# Patient Record
Sex: Female | Born: 1955 | Race: White | Hispanic: No | Marital: Married | Smoking: Former smoker
Health system: Southern US, Community
[De-identification: ages and names within clinical notes are randomized; demographics above are authoritative.]

## PROBLEM LIST (undated history)

## (undated) DIAGNOSIS — K219 Gastro-esophageal reflux disease without esophagitis: Secondary | ICD-10-CM

## (undated) DIAGNOSIS — M722 Plantar fascial fibromatosis: Secondary | ICD-10-CM

## (undated) DIAGNOSIS — M1990 Unspecified osteoarthritis, unspecified site: Secondary | ICD-10-CM

## (undated) DIAGNOSIS — F419 Anxiety disorder, unspecified: Secondary | ICD-10-CM

## (undated) DIAGNOSIS — K5790 Diverticulosis of intestine, part unspecified, without perforation or abscess without bleeding: Secondary | ICD-10-CM

## (undated) HISTORY — PX: OTHER SURGICAL HISTORY: SHX169

## (undated) HISTORY — PX: EYE SURGERY: SHX253

## (undated) HISTORY — PX: MOUTH SURGERY: SHX715

---

## 1998-09-18 ENCOUNTER — Other Ambulatory Visit: Admission: RE | Admit: 1998-09-18 | Discharge: 1998-09-18 | Payer: Self-pay | Admitting: Gynecology

## 2001-09-28 ENCOUNTER — Encounter: Payer: Self-pay | Admitting: Family Medicine

## 2001-09-28 ENCOUNTER — Encounter: Admission: RE | Admit: 2001-09-28 | Discharge: 2001-09-28 | Payer: Self-pay | Admitting: Family Medicine

## 2004-11-24 ENCOUNTER — Encounter: Admission: RE | Admit: 2004-11-24 | Discharge: 2004-11-24 | Payer: Self-pay | Admitting: Family Medicine

## 2004-12-09 ENCOUNTER — Other Ambulatory Visit: Admission: RE | Admit: 2004-12-09 | Discharge: 2004-12-09 | Payer: Self-pay | Admitting: Family Medicine

## 2007-03-13 ENCOUNTER — Other Ambulatory Visit: Admission: RE | Admit: 2007-03-13 | Discharge: 2007-03-13 | Payer: Self-pay | Admitting: Family Medicine

## 2007-03-13 ENCOUNTER — Encounter: Admission: RE | Admit: 2007-03-13 | Discharge: 2007-03-13 | Payer: Self-pay | Admitting: Family Medicine

## 2008-03-18 ENCOUNTER — Encounter: Admission: RE | Admit: 2008-03-18 | Discharge: 2008-03-18 | Payer: Self-pay | Admitting: Family Medicine

## 2008-04-04 ENCOUNTER — Encounter (INDEPENDENT_AMBULATORY_CARE_PROVIDER_SITE_OTHER): Payer: Self-pay | Admitting: Gynecology

## 2008-04-04 ENCOUNTER — Ambulatory Visit (HOSPITAL_BASED_OUTPATIENT_CLINIC_OR_DEPARTMENT_OTHER): Admission: RE | Admit: 2008-04-04 | Discharge: 2008-04-04 | Payer: Self-pay | Admitting: Gynecology

## 2009-05-05 ENCOUNTER — Encounter: Admission: RE | Admit: 2009-05-05 | Discharge: 2009-05-05 | Payer: Self-pay | Admitting: Gynecology

## 2010-06-02 ENCOUNTER — Encounter: Admission: RE | Admit: 2010-06-02 | Discharge: 2010-06-02 | Payer: Self-pay | Admitting: Gynecology

## 2010-11-17 NOTE — Op Note (Signed)
Stephanie Ford, Stephanie Ford               ACCOUNT NO.:  [id number]   MEDICAL RECORD NO.:  [id number]          PATIENT TYPE:  AMB   LOCATION:  [facility]                         FACILITY:  [facility]   PHYSICIAN:  Gretta Cool, M.D. DATE OF BIRTH:  [birth year]   DATE OF PROCEDURE:  04/04/2008  DATE OF DISCHARGE:                               OPERATIVE REPORT   PREOPERATIVE DIAGNOSIS:  Endometrial polyp versus fibroid, anterior  fundal wall.   POSTOPERATIVE DIAGNOSIS:  Submucous fibroid on the anterior fundal wall.   SURGEON:  Gretta Cool, M.D.   ANESTHESIA:  IV sedation and paracervical block.   DESCRIPTION OF PROCEDURE:  Under excellent IV sedation with the patient  prepped and draped in Allen stirrups, the cervix was grasped with a  single-tooth tenaculum and progressively dilated with a series of Pratt  dilators to accommodate the 7 mm resectoscope.  The resectoscope was  introduced and the endometrial cavity examined.  The large submucous  fibroid identified previously on ultrasound was photographed and then  resected.  The resection was carried out within the capsule of the  fibroid so as to remove and enucleate the fibroid entirely.  After  resection of that fibroid, the endometrial cavity was then resected  fully, so as to eliminate any probability of a recurrence of fibroids  encroaching the cavity, making further abnormal bleeding.  During the  resection, other fibroids were identified that were pea-sized and  resected, along with endometrial resection.  At this point, once the  cavity was completely resected, there is no evidence of endometrial  tissue remaining.  The cavity was then treated with VaporTrode 360  degrees, so as to eliminate any islands of endometrial tissue in the  superficial myometrium.  At this point, the procedure was terminated  without complication.  The patient returned to the recovery room in  excellent condition.     ______________________________  Gretta Cool, M.D.     CWL/MEDQ  D:  04/04/2008  T:  04/04/2008  Job:  [id number]   cc:   Katrina Stack, NP

## 2011-03-09 ENCOUNTER — Other Ambulatory Visit: Payer: Self-pay | Admitting: Gynecology

## 2011-04-06 LAB — POCT PREGNANCY, URINE: Preg Test, Ur: NEGATIVE

## 2011-05-14 ENCOUNTER — Other Ambulatory Visit: Payer: Self-pay | Admitting: Gynecology

## 2011-05-14 DIAGNOSIS — Z1231 Encounter for screening mammogram for malignant neoplasm of breast: Secondary | ICD-10-CM

## 2011-06-08 ENCOUNTER — Ambulatory Visit
Admission: RE | Admit: 2011-06-08 | Discharge: 2011-06-08 | Disposition: A | Discharge status: Home / Self Care | Payer: Self-pay | Source: Ambulatory Visit | Attending: Gynecology | Admitting: Gynecology

## 2011-06-08 DIAGNOSIS — Z1231 Encounter for screening mammogram for malignant neoplasm of breast: Secondary | ICD-10-CM

## 2012-04-05 ENCOUNTER — Other Ambulatory Visit: Payer: Self-pay | Admitting: Gynecology

## 2012-05-18 ENCOUNTER — Other Ambulatory Visit: Payer: Self-pay | Admitting: Family Medicine

## 2012-05-18 DIAGNOSIS — Z139 Encounter for screening, unspecified: Secondary | ICD-10-CM

## 2012-06-08 ENCOUNTER — Ambulatory Visit (INDEPENDENT_AMBULATORY_CARE_PROVIDER_SITE_OTHER): Payer: Managed Care, Other (non HMO)

## 2012-06-08 DIAGNOSIS — Z1231 Encounter for screening mammogram for malignant neoplasm of breast: Secondary | ICD-10-CM

## 2012-06-08 DIAGNOSIS — Z139 Encounter for screening, unspecified: Secondary | ICD-10-CM

## 2013-06-11 ENCOUNTER — Other Ambulatory Visit: Payer: Self-pay | Admitting: Gynecology

## 2013-06-11 DIAGNOSIS — Z1231 Encounter for screening mammogram for malignant neoplasm of breast: Secondary | ICD-10-CM

## 2013-06-12 ENCOUNTER — Ambulatory Visit (INDEPENDENT_AMBULATORY_CARE_PROVIDER_SITE_OTHER): Payer: Managed Care, Other (non HMO)

## 2013-06-12 DIAGNOSIS — Z1231 Encounter for screening mammogram for malignant neoplasm of breast: Secondary | ICD-10-CM

## 2015-08-21 ENCOUNTER — Other Ambulatory Visit: Payer: Self-pay | Admitting: Gynecology

## 2015-08-21 DIAGNOSIS — N63 Unspecified lump in unspecified breast: Secondary | ICD-10-CM

## 2015-08-27 ENCOUNTER — Ambulatory Visit
Admission: RE | Admit: 2015-08-27 | Discharge: 2015-08-27 | Disposition: A | Discharge status: Home / Self Care | Payer: Managed Care, Other (non HMO) | Source: Ambulatory Visit | Attending: Gynecology | Admitting: Gynecology

## 2015-08-27 DIAGNOSIS — N63 Unspecified lump in unspecified breast: Secondary | ICD-10-CM

## 2017-04-13 ENCOUNTER — Other Ambulatory Visit: Payer: Self-pay | Admitting: Gastroenterology

## 2017-04-13 DIAGNOSIS — K5732 Diverticulitis of large intestine without perforation or abscess without bleeding: Secondary | ICD-10-CM

## 2017-04-27 ENCOUNTER — Ambulatory Visit
Admission: RE | Admit: 2017-04-27 | Discharge: 2017-04-27 | Disposition: A | Discharge status: Home / Self Care | Payer: 59 | Source: Ambulatory Visit | Attending: Gastroenterology | Admitting: Gastroenterology

## 2017-04-27 DIAGNOSIS — K5732 Diverticulitis of large intestine without perforation or abscess without bleeding: Secondary | ICD-10-CM

## 2017-04-27 MED ORDER — IOPAMIDOL (ISOVUE-300) INJECTION 61%
100.0000 mL | Freq: Once | INTRAVENOUS | Status: AC | PRN
  Administered 2017-04-27: 100 mL via INTRAVENOUS

## 2017-04-28 ENCOUNTER — Other Ambulatory Visit: Payer: Self-pay | Admitting: Gastroenterology

## 2017-04-28 ENCOUNTER — Ambulatory Visit
Admission: RE | Admit: 2017-04-28 | Discharge: 2017-04-28 | Disposition: A | Discharge status: Home / Self Care | Payer: Self-pay | Source: Ambulatory Visit | Attending: Gastroenterology | Admitting: Gastroenterology

## 2017-04-28 DIAGNOSIS — R52 Pain, unspecified: Secondary | ICD-10-CM

## 2017-05-11 ENCOUNTER — Ambulatory Visit: Payer: Self-pay | Admitting: General Surgery

## 2017-05-11 NOTE — H&P (Signed)
Stephanie Ford 05/11/2017 10:29 AM Location: [facility] Patient #: [id number] DOB: 15-Aug-1955 Married / Language: [place] / Race: White Female  History of Present Illness Stephanie Ford(Stephanie Ford; 05/11/2017 1:08 PM) The patient is a 61 year old female who presents with diverticulitis. She is referred by Dr Ewing SchleinMagod for ongoing complicated diveritculitis. She lives in [place] where her husband is a Buyer, retailgovernment contractor. She reports that around the first week of September she had left lower quadrant pain. After about a week it did not improve so she went to the emergency room and [place] the hospital [place] outside country. She underwent a CT scan which showed sigmoid diverticulitis with several undrainable abscesses. She was placed on IV antibiotics and admitted for a week. Repeat CT showed no improvement and the surgeons in that country recommended surgery however [place]. She saw gastroenterology and was placed back on oral antibiotics which had to be switched to a different type due to intolerance. She underwent a repeat CT scan on October 24 which showed no significant improvement in the diverticular abscess. She has an ongoing fluid collection of 4.8 x 3.4 cm with multiple small gastric loculations which are not felt to be amenable to percutaneous drainage. The gas collection abuts the right ovary. She completed her antibiotic course. She denies any fevers, chills, diarrhea. She is taking MiraLAX daily. Her appetite is finally back to normal. She denies any nausea or vomiting. She is still having intermittent symptoms though. Today has been a good day yesterday she had nagging pain along her left flank and left side the entire day.  She states that she had a prior colonoscopy with Dr. Madilyn FiremanHayes about 3 years ago which was unremarkable.  She denies any melena or hematochezia   Problem List/Past Medical Stephanie Ford(Stephanie Greb M. Andrey CampanileWilson,  Ford; 05/11/2017 1:15 PM) ABSCESS OF SIGMOID COLON DUE TO DIVERTICULITIS (E45.40(K57.20)  Past Surgical History Christianne Dolin(Christen Lambert, RMA; 05/11/2017 10:29 AM) Oral Surgery  Diagnostic Studies History Christianne Dolin(Christen Lambert, [place]; 05/11/2017 10:29 AM) Colonoscopy 1-5 years ago Mammogram 1-3 years ago Pap Smear 1-5 years ago  Allergies Christianne Dolin(Christen Lambert, RMA; 05/11/2017 10:30 AM) CODEINE Vomiting, Itching. skin crawling  Medication History Christianne Dolin(Christen Lambert, RMA; 05/11/2017 10:30 AM) Vassie MoselleMinivelle (0.05MG /98JX/24HR Patch TW, Transdermal) Active. Pantoprazole Sodium (40MG  Tablet DR, Oral) Active. Progesterone Micronized (200MG  Capsule, Oral) Active. Polyethylene Glycol 3350 (Oral) Active. Advil (200MG  Capsule, Oral) Active. Probiotic (Oral) Active. Medications Reconciled  Social History Christianne Dolin(Christen Lambert, [place]; 05/11/2017 10:29 AM) Alcohol use Moderate alcohol use. Caffeine use Coffee. No drug use Tobacco use Former smoker.  Family History Christianne Dolin(Christen Lambert, [place]; 05/11/2017 10:29 AM) Arthritis Mother. Cancer Sister. Heart Disease Father. Prostate Cancer Father. Respiratory Condition Father.  Pregnancy / Birth History Christianne Dolin(Christen Lambert, [place]; 05/11/2017 10:29 AM) Age at menarche 14 years. Age of menopause [age] Gravida 0 Irregular periods Para 0  Other Problems Stephanie Ford(Stephanie Ford; 05/11/2017 1:15 PM) Arthritis Back Pain Diverticulosis Gastroesophageal Reflux Disease     Review of Systems Christianne Dolin(Christen Lambert RMA; 05/11/2017 10:29 AM) General Not Present- Appetite Loss, Chills, Fatigue, Fever, Night Sweats, Weight Gain and Weight Loss. Skin Not Present- Change in Wart/Mole, Dryness, Hives, Jaundice, New Lesions, Non-Healing Wounds, Rash and Ulcer. HEENT Not Present- Earache, Hearing Loss, Hoarseness, Nose Bleed, Oral Ulcers, Ringing in the Ears, Seasonal Allergies, Sinus Pain, Sore Throat, Visual Disturbances, Wears glasses/contact lenses and Yellow Eyes. Respiratory Not  Present- Bloody sputum, Chronic Cough, Difficulty Breathing, Snoring and Wheezing. Breast  Not Present- Breast Mass, Breast Pain, Nipple Discharge and Skin Changes. Cardiovascular Not Present- Chest Pain, Difficulty Breathing Lying Down, Leg Cramps, Palpitations, Rapid Heart Rate, Shortness of Breath and Swelling of Extremities. Gastrointestinal Present- Abdominal Pain, Change in Bowel Habits and Constipation. Not Present- Bloating, Bloody Stool, Chronic diarrhea, Difficulty Swallowing, Excessive gas, Gets full quickly at meals, Hemorrhoids, Indigestion, Nausea, Rectal Pain and Vomiting. Female Genitourinary Not Present- Frequency, Nocturia, Painful Urination, Pelvic Pain and Urgency. Musculoskeletal Not Present- Back Pain, Joint Pain, Joint Stiffness, Muscle Pain, Muscle Weakness and Swelling of Extremities. Neurological Not Present- Decreased Memory, Fainting, Headaches, Numbness, Seizures, Tingling, Tremor, Trouble walking and Weakness. Psychiatric Not Present- Anxiety, Bipolar, Change in Sleep Pattern, Depression, Fearful and Frequent crying. Endocrine Not Present- Cold Intolerance, Excessive Hunger, Hair Changes, Heat Intolerance, Hot flashes and New Diabetes. Hematology Not Present- Blood Thinners, Easy Bruising, Excessive bleeding, Gland problems, HIV and Persistent Infections.  Vitals Christianne Dolin(Christen Lambert RMA; 05/11/2017 10:31 AM) 05/11/2017 10:31 AM Weight: 186 lb Height: 64in Body Surface Area: 1.9 m Body Mass Index: 31.93 kg/m  Temp.: 97.65F  Pulse: 80 (Regular)  BP: 142/90 (Sitting, Left Arm, Standard)      Physical Exam Stephanie Ford(Stephanie Ford; 05/11/2017 1:03 PM)  General Mental Status-Alert. General Appearance-Consistent with stated age. Hydration-Well hydrated. Voice-Normal.  Head and Neck Head-normocephalic, atraumatic with no lesions or palpable masses. Trachea-midline. Thyroid Gland Characteristics - normal size and consistency.  Eye Eyeball -  Bilateral-Extraocular movements intact. Sclera/Conjunctiva - Bilateral-No scleral icterus.  ENMT Mouth and Throat -Note:lips intact.  Note: normal external ears  Chest and Lung Exam Chest and lung exam reveals -quiet, even and easy respiratory effort with no use of accessory muscles and on auscultation, normal breath sounds, no adventitious sounds and normal vocal resonance. Inspection Chest Wall - Normal. Back - normal.  Breast - Did not examine.  Cardiovascular Cardiovascular examination reveals -normal heart sounds, regular rate and rhythm with no murmurs and normal pedal pulses bilaterally.  Abdomen Inspection Inspection of the abdomen reveals - No Hernias. Skin - Scar - no surgical scars. Palpation/Percussion Palpation and Percussion of the abdomen reveal - Soft, Non Tender, No Rebound tenderness, No Rigidity (guarding) and No hepatosplenomegaly. Auscultation Auscultation of the abdomen reveals - Bowel sounds normal.  Peripheral Vascular Upper Extremity Palpation - Pulses bilaterally normal.  Neurologic Neurologic evaluation reveals -alert and oriented x 3 with no impairment of recent or remote memory. Mental Status-Normal.  Neuropsychiatric The patient's mood and affect are described as -normal. Judgment and Insight-insight is appropriate concerning matters relevant to self.  Musculoskeletal Normal Exam - Left-Upper Extremity Strength Normal and Lower Extremity Strength Normal. Normal Exam - Right-Upper Extremity Strength Normal and Lower Extremity Strength Normal.  Lymphatic Head & Neck  General Head & Neck Lymphatics: Bilateral - Description - Normal. Axillary - Did not examine. Femoral & Inguinal - Did not examine.    Assessment & Plan Stephanie Ford(Stephanie Ford; 05/11/2017 1:16 PM)  ABSCESS OF SIGMOID COLON DUE TO DIVERTICULITIS (K57.20) Story: I then explained to her and her family member that I am out of town the next 2 weeks and that  probably my first availability would be sometime in mid December which I don't think the patient can wait for. Therefore I have discussed her case with Dr. Maisie Fushomas one of our colorectal surgeons who has agreed to take over her care. We will [facility]. At that point Dr. Maisie Fushomas will discuss surgical plans with the  patient. In the interim we have discussed the importance of good nutrition Impression: She has complicated sigmoid diverticulitis with phlegmon that has been going on since early September with no significant radiological improvement. I am not optimistic that another round of antibiotics will be of much benefit. It also doesn't appear to be amenable to percutaneous drainage. We discussed the long-term ramifications of smoldering complicated diverticulitis. At this point I recommended surgery. We discussed that the standard of care would be a sigmoid colectomy with end colostomy. We discussed that sometimes we may be able to do a primary one staged surgery but intraoperative conditions would have to be extremely favorable in order to do a resection with anastomosis. We discussed the risk of performing an anastomosis in a contaminated field which would be an increased risk of leak. We discussed that often times is an intraoperative decision about the safety of performing anastomosis  I discussed the procedure in detail. The patient was given Agricultural engineer. We discussed the risks and benefits of surgery including, but not limited to bleeding, infection (such as wound infection, abdominal abscess), injury to surrounding structures, blood clot formation, urinary retention, incisional hernia, colostomy issues (hernia/ischemia), [ anastomotic stricture, anastomotic leak], anesthesia risks, pulmonary & cardiac complications such as pneumonia &/or heart attack, need for additional procedures, ileus, & prolonged hospitalization. We discussed the typical  postoperative recovery course, including limitations & restrictions postoperatively. I explained that the likelihood of improvement in their symptoms is good.  The patient has elected to proceed with surgery.  Current Plans Schedule for Surgery Pt Education - Pamphlet Given - Colorectal Surgery: discussed with patient and provided information.  Mary Sella. Andrey Campanile, Ford, FACS General, Bariatric, & Minimally Invasive Surgery [facility], [place]

## 2017-05-12 NOTE — Progress Notes (Signed)
Please place orders in Epic as patient is being scheduled for a pre-op appointment! Thank you! 

## 2017-05-16 ENCOUNTER — Inpatient Hospital Stay (HOSPITAL_COMMUNITY): Payer: 59

## 2017-05-16 ENCOUNTER — Encounter (HOSPITAL_COMMUNITY): Payer: Self-pay

## 2017-05-16 ENCOUNTER — Other Ambulatory Visit: Payer: Self-pay

## 2017-05-16 ENCOUNTER — Observation Stay (HOSPITAL_COMMUNITY)
Admission: RE | Admit: 2017-05-16 | Discharge: 2017-05-17 | Disposition: A | Discharge status: Home / Self Care | Payer: 59 | Source: Ambulatory Visit | Attending: General Surgery | Admitting: General Surgery

## 2017-05-16 DIAGNOSIS — Z79899 Other long term (current) drug therapy: Secondary | ICD-10-CM | POA: Insufficient documentation

## 2017-05-16 DIAGNOSIS — K5720 Diverticulitis of large intestine with perforation and abscess without bleeding: Principal | ICD-10-CM | POA: Diagnosis present

## 2017-05-16 HISTORY — DX: Anxiety disorder, unspecified: F41.9

## 2017-05-16 HISTORY — DX: Gastro-esophageal reflux disease without esophagitis: K21.9

## 2017-05-16 LAB — BASIC METABOLIC PANEL
Anion gap: 7 (ref 5–15)
BUN: 9 mg/dL (ref 6–20)
CHLORIDE: 106 mmol/L (ref 101–111)
CO2: 27 mmol/L (ref 22–32)
CREATININE: 0.58 mg/dL (ref 0.44–1.00)
Calcium: 8.7 mg/dL — ABNORMAL LOW (ref 8.9–10.3)
GFR calc Af Amer: >60 mL/min (ref >60)
GFR calc non Af Amer: >60 mL/min (ref >60)
GLUCOSE: 103 mg/dL — AB (ref 65–99)
Potassium: 3.7 mmol/L (ref 3.5–5.1)
Sodium: 140 mmol/L (ref 135–145)

## 2017-05-16 LAB — CBC
HCT: 36 % (ref 36.0–46.0)
Hemoglobin: 12 g/dL (ref 12.0–15.0)
MCH: 30.6 pg (ref 26.0–34.0)
MCHC: 33.3 g/dL (ref 30.0–36.0)
MCV: 91.8 fL (ref 78.0–100.0)
PLATELETS: 292 10*3/uL (ref 150–400)
RBC: 3.92 MIL/uL (ref 3.87–5.11)
RDW: 14.8 % (ref 11.5–15.5)
WBC: 5.3 10*3/uL (ref 4.0–10.5)

## 2017-05-16 MED ORDER — PIPERACILLIN-TAZOBACTAM 3.375 G IVPB
3.3750 g | Freq: Three times a day (TID) | INTRAVENOUS | Status: DC
  Administered 2017-05-16 – 2017-05-17 (×3): 3.375 g via INTRAVENOUS
  Filled 2017-05-16 (×3): qty 50

## 2017-05-16 MED ORDER — POLYETHYLENE GLYCOL 3350 17 G PO PACK: 17.0000 g | PACK | Freq: Every day | ORAL | Status: DC | PRN

## 2017-05-16 MED ORDER — KCL IN DEXTROSE-NACL 20-5-0.45 MEQ/L-%-% IV SOLN
INTRAVENOUS | Status: DC
  Administered 2017-05-16: 15:00:00 via INTRAVENOUS
  Filled 2017-05-16 (×2): qty 1000

## 2017-05-16 MED ORDER — LORAZEPAM 0.5 MG PO TABS
0.5000 mg | ORAL_TABLET | Freq: Two times a day (BID) | ORAL | Status: DC | PRN
  Administered 2017-05-16: 0.5 mg via ORAL
  Filled 2017-05-16: qty 1

## 2017-05-16 MED ORDER — IOPAMIDOL (ISOVUE-300) INJECTION 61%
INTRAVENOUS | Status: AC
  Filled 2017-05-16: qty 30

## 2017-05-16 MED ORDER — ACETAMINOPHEN 325 MG PO TABS
650.0000 mg | ORAL_TABLET | Freq: Four times a day (QID) | ORAL | Status: DC | PRN
  Administered 2017-05-16: 650 mg via ORAL
  Filled 2017-05-16: qty 2

## 2017-05-16 MED ORDER — ACETAMINOPHEN 650 MG RE SUPP: 650.0000 mg | Freq: Four times a day (QID) | RECTAL | Status: DC | PRN

## 2017-05-16 MED ORDER — FENTANYL CITRATE (PF) 100 MCG/2ML IJ SOLN: 12.5000 ug | INTRAMUSCULAR | Status: DC | PRN

## 2017-05-16 MED ORDER — ONDANSETRON 4 MG PO TBDP: 4.0000 mg | ORAL_TABLET | Freq: Four times a day (QID) | ORAL | Status: DC | PRN

## 2017-05-16 MED ORDER — PANTOPRAZOLE SODIUM 40 MG IV SOLR: 40.0000 mg | Freq: Every day | INTRAVENOUS | Status: DC

## 2017-05-16 MED ORDER — SACCHAROMYCES BOULARDII 250 MG PO CAPS
250.0000 mg | ORAL_CAPSULE | Freq: Every day | ORAL | Status: DC
  Administered 2017-05-17: 250 mg via ORAL
  Filled 2017-05-16: qty 1

## 2017-05-16 MED ORDER — DIPHENHYDRAMINE HCL 25 MG PO CAPS: 25.0000 mg | ORAL_CAPSULE | Freq: Four times a day (QID) | ORAL | Status: DC | PRN

## 2017-05-16 MED ORDER — IOPAMIDOL (ISOVUE-300) INJECTION 61%
100.0000 mL | Freq: Once | INTRAVENOUS | Status: AC | PRN
  Administered 2017-05-16: 100 mL via INTRAVENOUS

## 2017-05-16 MED ORDER — MORPHINE SULFATE (PF) 2 MG/ML IV SOLN: 1.0000 mg | INTRAVENOUS | Status: DC | PRN

## 2017-05-16 MED ORDER — IOPAMIDOL (ISOVUE-300) INJECTION 61%
15.0000 mL | Freq: Two times a day (BID) | INTRAVENOUS | Status: AC | PRN
  Administered 2017-05-16 (×2): 15 mL via ORAL
  Filled 2017-05-16 (×2): qty 30

## 2017-05-16 MED ORDER — DIPHENHYDRAMINE HCL 50 MG/ML IJ SOLN: 25.0000 mg | Freq: Four times a day (QID) | INTRAMUSCULAR | Status: DC | PRN

## 2017-05-16 MED ORDER — TRAMADOL HCL 50 MG PO TABS: 50.0000 mg | ORAL_TABLET | Freq: Four times a day (QID) | ORAL | Status: DC | PRN

## 2017-05-16 MED ORDER — IOPAMIDOL (ISOVUE-300) INJECTION 61%
INTRAVENOUS | Status: AC
  Filled 2017-05-16: qty 100

## 2017-05-16 MED ORDER — ONDANSETRON HCL 4 MG/2ML IJ SOLN: 4.0000 mg | Freq: Four times a day (QID) | INTRAMUSCULAR | Status: DC | PRN

## 2017-05-16 MED ORDER — ENOXAPARIN SODIUM 40 MG/0.4ML ~~LOC~~ SOLN: 40.0000 mg | SUBCUTANEOUS | Status: DC

## 2017-05-16 MED ORDER — HYDRALAZINE HCL 20 MG/ML IJ SOLN: 5.0000 mg | Freq: Four times a day (QID) | INTRAMUSCULAR | Status: DC | PRN

## 2017-05-16 NOTE — H&P (Signed)
[facility] Admission Note  Stephanie Ford 04-15-1956  [id number].    Chief Complaint/Reason for Consult: diverticulitis with abscess  HPI:  The patient is a 61 year old female who presents with diverticulitis. She is being directly [facility] for surgical management of diverticulitis. She was referred to Dr. Gaynelle AduEric Wilson by Dr Ewing SchleinMagod for ongoing complicated diveritculitis. She lives in [place] where her husband is a Buyer, retailgovernment contractor. She reports that around the first week of September she had LLQ pain and after about a week it did not improve so she went to the emergency room and was [place] the hospital [place] outside country. She underwent a CT scan which showed sigmoid diverticulitis with several undrainable abscesses. She was placed on IV antibiotics and admitted for a week. Repeat CT showed no improvement and the surgeons in that country recommended surgery however the [place]. She saw GI and was placed back on oral antibiotics which had to be switched to a different type due to intolerance. She underwent a repeat CT scan on 04/27/17 which showed no significant improvement in the diverticular abscess. She has an ongoing fluid collection of 4.8 x 3.4 cm with multiple small gastric loculations which are not felt to be amenable to percutaneous drainage. The gas collection abuts the right ovary. She completed her antibiotic course. She denies any fevers, chills, diarrhea. She is taking MiraLAX daily. Her appetite is finally back to normal. She denies any nausea, vomiting, melena, or hematochezia. She is still having intermittent left lower abdominal/flank pain. [place]. 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 She has two adopted sons.   She reports nausea/vomiting to PO codeine pills after an oral surgery. States she has tolerated IV morphine after her GYN surgery many years ago.   ROS: Review of Systems  Constitutional: Negative for chills and fever.  Gastrointestinal: Positive for abdominal pain and constipation.  All other systems reviewed and are negative.   No family history on file.  No past medical history on file.  No past surgical history on file.  Social History:  has no tobacco, alcohol, and drug history on file.  Allergies:  Allergies  Allergen Reactions  . Codeine Itching and Nausea And Vomiting    Vomiting, itching, makes skin crawl     Medications Prior to Admission  Medication Sig Dispense Refill  . estradiol (MINIVELLE) 0.05 MG/24HR patch Place 1 patch See admin instructions onto the skin. Every 5 days    . ibuprofen (ADVIL,MOTRIN) 200 MG tablet Take 600 mg every 6 (six) hours as needed by mouth for mild pain or moderate pain.    Marland Kitchen. LORazepam (ATIVAN) 0.5 MG tablet Take 0.5 mg 2 (two) times daily as needed by mouth for anxiety.    . pantoprazole (PROTONIX) 40 MG tablet Take 40 mg daily by mouth.    . polyethylene glycol (MIRALAX / GLYCOLAX) packet Take 17 g daily as needed by mouth for mild constipation.    . Probiotic Product (PROBIOTIC DAILY) CAPS Take 1 capsule daily by mouth.    . progesterone (PROMETRIUM) 200 MG capsule Take 200 mg See admin instructions by mouth. Every other month - for 14 days  Blood pressure (!) 149/86, pulse 73, temperature 98 F (36.7 C), temperature source Oral, resp. rate 18, SpO2 100 %. Physical Exam: Physical Exam  Constitutional: She is oriented to person, place, and time. She appears well-developed and well-nourished. No distress.  HENT:  Head: Normocephalic and atraumatic.  Right Ear: External ear normal.  Left Ear: External ear normal.  Mouth/Throat: No oropharyngeal exudate.  Eyes: Conjunctivae and EOM  are normal. Pupils are equal, round, and reactive to light. Right eye exhibits no discharge. Left eye exhibits no discharge.  Neck: Normal range of motion. Neck supple. No JVD present. No tracheal deviation present. No thyromegaly present.  Cardiovascular: Normal rate, regular rhythm and normal heart sounds. Exam reveals no gallop and no friction rub.  No murmur heard. Pulmonary/Chest: Effort normal and breath sounds normal. No stridor. No respiratory distress. She has no wheezes. She has no rales.  Abdominal: Soft. Bowel sounds are normal. She exhibits no distension and no mass. There is tenderness. There is no rebound and no guarding.  Deep palpation LLQ elicits left flank tenderness.  Musculoskeletal: Normal range of motion. She exhibits no edema or deformity.  Neurological: She is alert and oriented to person, place, and time. No sensory deficit.  Skin: Skin is warm and dry. No rash noted. She is not diaphoretic.  Psychiatric: She has a normal mood and affect. Her behavior is normal.    No results found for this or any previous visit (from the past 48 hour(s)). No results found.  Assessment/Plan Sigmoid diverticulitis with abscess, refractory to medical treatment with IV/PO antibiotics   -  NPO. STAT CT abdomen to re-assess abscesses and see if they are amenable to percutaneous drainage.   - perc drainage with IV abx   Vs.  OR tomorrow for lap assisted sigmoid colectomy/colostomy   - draw baseline labs - CBC, CMET  - start IV abx, Zosyn   - will discuss desired bowel prep with MD.  - PRN pain control, antiemetics   FEN: NPO  ID: Zosyn 11/12 >> VTE: SCD's, Lovenox today then hold for surgery Foley: none    Adam PhenixElizabeth S Simaan, [facility] 05/16/2017, 10:16 AM Pager: [phone] Consults: [phone] Mon-Fri 7:00 am-4:30 pm Sat-Sun 7:00 am-11:30 am

## 2017-05-16 NOTE — Progress Notes (Signed)
ATTENDING ADDENDUM:  I personally reviewed patient's record, examined the patient, and formulated the following assessment and plan:  Pt with minimal symptoms.  Discussed with Dr Lowella DandyHenn of IR.  He felt that collection is able to be drained.  We will repeat her CT today and tentatively plan for drain placement tomorrow instead of surgery.  Discussed with Pt and she agrees.  If no improvement, can always perform surgery later.    Vanita PandaAlicia C Valari Taylor, MD  Colorectal and General Surgery [facility]

## 2017-05-17 ENCOUNTER — Encounter (HOSPITAL_COMMUNITY): Admission: RE | Disposition: A | Discharge status: Home / Self Care | Payer: Self-pay | Source: Ambulatory Visit

## 2017-05-17 ENCOUNTER — Ambulatory Visit: Payer: Self-pay | Admitting: General Surgery

## 2017-05-17 DIAGNOSIS — K5720 Diverticulitis of large intestine with perforation and abscess without bleeding: Secondary | ICD-10-CM | POA: Diagnosis not present

## 2017-05-17 LAB — HIV ANTIBODY (ROUTINE TESTING W REFLEX): HIV Screen 4th Generation wRfx: NONREACTIVE

## 2017-05-17 LAB — PROTIME-INR
INR: 1
PROTHROMBIN TIME: 13.1 s (ref 11.4–15.2)

## 2017-05-17 SURGERY — COLECTOMY, SIGMOID, LAPAROSCOPIC
Anesthesia: General

## 2017-05-17 NOTE — Discharge Summary (Signed)
Physician Discharge Summary  Patient ID: Stephanie Ford MRN: [id number] DOB/AGE: 1955/11/26 61 y.o.  Admit date: 05/16/2017 Discharge date: 05/17/2017  Admission Diagnoses: diverticular abscess  Discharge Diagnoses:  Active Problems:   Diverticulitis of large intestine with abscess   Discharged Condition: good  Hospital Course: Pt admitted for diverticular abscess.  Repeat CT performed which showed resolving abscess.  Unable to be drained in IR.  Pt will be scheduled for elective surgery next month.  Consults: None  Significant Diagnostic Studies: labs: cbc, chemistry  Treatments: IV hydration and antibiotics: Zosyn  Discharge Exam: Blood pressure (!) 155/82, pulse 66, temperature 98.2 F (36.8 C), temperature source Oral, resp. rate 16, height 5\' 4"  (1.626 m), weight 83.9 kg (185 lb), SpO2 98 %. General appearance: alert and cooperative GI: soft  Disposition: Home   Allergies as of 05/17/2017      Reactions   Codeine Itching, Nausea And Vomiting   Vomiting, itching, makes skin crawl       Medication List    TAKE these medications   ibuprofen 200 MG tablet Commonly known as:  ADVIL,MOTRIN Take 600 mg every 6 (six) hours as needed by mouth for mild pain or moderate pain.   LORazepam 0.5 MG tablet Commonly known as:  ATIVAN Take 0.5 mg 2 (two) times daily as needed by mouth for anxiety.   MINIVELLE 0.05 MG/24HR patch Generic drug:  estradiol Place 1 patch See admin instructions onto the skin. Every 5 days   pantoprazole 40 MG tablet Commonly known as:  PROTONIX Take 40 mg daily by mouth.   polyethylene glycol packet Commonly known as:  MIRALAX / GLYCOLAX Take 17 g daily as needed by mouth for mild constipation.   PROBIOTIC DAILY Caps Take 1 capsule daily by mouth.   progesterone 200 MG capsule Commonly known as:  PROMETRIUM Take 200 mg See admin instructions by mouth. Every other month - for 14 days        Signed: Vanita PandaHOMAS, Pearlina Friedly C. 05/17/2017,  10:22 AM

## 2017-05-17 NOTE — Progress Notes (Signed)
[facility] Progress Note     Subjective: CC: No pain at rest. L flank pain with certain movements. Denies fever, chills, nausea, vomiting.   Reports severe GI upset with augmentin.  Objective: Vital signs in last 24 hours: Temp:  [97.5 F (36.4 C)-98.2 F (36.8 C)] 98.2 F (36.8 C) (11/13 0628) Pulse Rate:  [66-77] 77 (11/13 0628) Resp:  [16-18] 18 (11/13 0628) BP: (124-149)/(71-86) 134/71 (11/13 0628) SpO2:  [97 %-100 %] 98 % (11/13 0628) Weight:  [83.9 kg (185 lb)] 83.9 kg (185 lb) (11/12 1442)    Intake/Output from previous day: 11/12 0701 - 11/13 0700 In: 216.7 [I.V.:166.7; IV Piggyback:50] Out: 1750 [Urine:1750] Intake/Output this shift: No intake/output data recorded.  PE: Gen:  Alert, NAD, pleasant Card:  Regular rate and rhythm, pedal pulses 2+ BL Pulm:  Normal effort, clear to auscultation bilaterally Abd: Soft, non-tender, non-distended, bowel sounds present in all 4 quadrants Skin: warm and dry, no rashes  Psych: A&Ox3   Lab Results:  Recent Labs    05/16/17 1022  WBC 5.3  HGB 12.0  HCT 36.0  PLT 292   BMET Recent Labs    05/16/17 1022  NA 140  K 3.7  CL 106  CO2 27  GLUCOSE 103*  BUN 9  CREATININE 0.58  CALCIUM 8.7*   PT/INR No results for input(s): LABPROT, INR in the last 72 hours. CMP     Component Value Date/Time   NA 140 05/16/2017 1022   K 3.7 05/16/2017 1022   CL 106 05/16/2017 1022   CO2 27 05/16/2017 1022   GLUCOSE 103 (H) 05/16/2017 1022   BUN 9 05/16/2017 1022   CREATININE 0.58 05/16/2017 1022   CALCIUM 8.7 (L) 05/16/2017 1022   GFRNONAA >60 05/16/2017 1022   GFRAA >60 05/16/2017 1022   Lipase  No results found for: LIPASE     Studies/Results: Ct Abdomen Pelvis W Contrast  Result Date: 05/16/2017 CLINICAL DATA:  Sigmoid diverticulitis with abscess. Evaluate for interval change. EXAM: CT ABDOMEN AND PELVIS WITH CONTRAST TECHNIQUE: Multidetector CT imaging of the abdomen and pelvis was performed  using the standard protocol following bolus administration of intravenous contrast. CONTRAST:  100mL ISOVUE-300 IOPAMIDOL (ISOVUE-300) INJECTION 61% COMPARISON:  CT abdomen pelvis dated April 27, 2017. FINDINGS: Lower chest: No acute abnormality. Hepatobiliary: No focal liver abnormality is seen. No gallstones, gallbladder wall thickening, or biliary dilatation. Pancreas: Unremarkable. No pancreatic ductal dilatation or surrounding inflammatory changes. Spleen: Normal in size without focal abnormality. Adrenals/Urinary Tract: Adrenal glands are unremarkable. Kidneys are normal, without renal calculi, focal lesion, or hydronephrosis. Bladder is unremarkable. Stomach/Bowel: Extensive left-sided diverticulosis again noted, with improving inflammation surrounding the mid sigmoid colon. The stomach and small bowel are unremarkable. Normal appendix. No bowel obstruction. Vascular/Lymphatic: No significant vascular findings are present. Probable mixing artifact in the proximal portal vein. No enlarged abdominal or pelvic lymph nodes. Reproductive: Phlegmonous change abuts the left ovary. The uterus and right ovary are grossly unremarkable. Other: Interval decrease in size of the diverticular abscess, difficult to measure, but with residual phlegmon and extraluminal gas measuring approximately 2.1 x 1.5 x 3.3 cm (AP by transverse by CC), previously 4.8 x 3.4 x 5.7 cm. Musculoskeletal: No acute or significant osseous findings. IMPRESSION: 1. Improving sigmoid diverticulosis with interval decrease in size of the diverticular abscess along the mid sigmoid colon. The abscess is difficult to measure, however, residual phlegmonous change and loculated extraluminal gas measures approximately 2.1 x 1.5 x 3.3 cm. Electronically Signed  By: Obie DredgeWilliam T Derry M.D.   On: 05/16/2017 16:55    Anti-infectives: Anti-infectives (From admission, onward)   Start     Dose/Rate Route Frequency Ordered Stop   05/16/17 1400   piperacillin-tazobactam (ZOSYN) IVPB 3.375 g     3.375 g 12.5 mL/hr over 240 Minutes Intravenous Every 8 hours 05/16/17 1014       Assessment/Plan Sigmoid diverticulitis with abscess, refractory to medical treatment with IV/PO antibiotics  -  NPO, IVF  - repeat CT 11/12 shows interval improvement of diverticular abscess. Dr. Grace IsaacWatts to review and give recommendations regarding drainage today. Coags pending.  - continue IV abx, Zosyn  - PRN pain and nausea meds - further surgical planning to follow IR recommendations.   FEN: NPO  ID: Zosyn 11/12 >> VTE: SCD's, Lovenox held for possible procedure.  Foley: none     LOS: 1 day    Adam PhenixElizabeth S Bronte Kropf , [facility] 05/17/2017, 8:10 AM Pager: [phone] Consults: [phone] Mon-Fri 7:00 am-4:30 pm Sat-Sun 7:00 am-11:30 am

## 2017-05-17 NOTE — Progress Notes (Signed)
Discharge instructions reviewed with patient. Questions answered. Patient denies further questions. Son is en route to drive patient home. No prescriptions given to patient. Lina SarBeth Javin Nong, RN

## 2017-06-09 NOTE — Patient Instructions (Addendum)
Stephanie Ford  06/09/2017   Your procedure is scheduled on: 06-15-17  Report to [facility] Main  Entrance Take [place]  elevators to 3rd floor to  Short Stay Center at  0600 AM.    Call this number if you have problems the morning of surgery [phone]    Remember: ONLY 1 PERSON MAY GO WITH YOU TO SHORT STAY TO GET  READY MORNING OF YOUR SURGERY.  Do not eat food :After Midnight.MONDAY NIGHT CLEAR LIQUIDS  ALL DAY Tuesday 06-14-17 WITH BOWEL PREP INSTRUCTIONS FROM DR Maisie Fus, NO CLEAR LIQUIDS AFTER MIDNIGHT Tuesday NIGHT.     Take these medicines the morning of surgery with A SIP OF WATER: PROTONIX, ATIVAN IF NEEDED                                You may not have any metal on your body including hair pins and              piercings  Do not wear jewelry, make-up, lotions, powders or perfumes, deodorant             Do not wear nail polish.  Do not shave  48 hours prior to surgery.             Do not bring valuables to the hospital. [facility] IS NOT             RESPONSIBLE   FOR VALUABLES.  Contacts, dentures or bridgework may not be worn into surgery.  Leave suitcase in the car. After surgery it may be brought to your room.               Please read over the following fact sheets you were given: _____________________________________________________________________          [facility] - Preparing for Surgery Before surgery, you can play an important role.  Because skin is not sterile, your skin needs to be as free of germs as possible.  You can reduce the number of germs on your skin by washing with CHG (chlorahexidine gluconate) soap before surgery.  CHG is an antiseptic cleaner which kills germs and bonds with the skin to continue killing germs even after washing. Please DO NOT use if you have an allergy to CHG or antibacterial soaps.  If your skin becomes reddened/irritated stop using the CHG and inform your nurse when you arrive at Short Stay. Do not  shave (including legs and underarms) for at least 48 hours prior to the first CHG shower.  You may shave your face/neck. Please follow these instructions carefully:  1.  Shower with CHG Soap the night before surgery and the  morning of Surgery.  2.  If you choose to wash your hair, wash your hair first as usual with your  normal  shampoo.  3.  After you shampoo, rinse your hair and body thoroughly to remove the  shampoo.                           4.  Use CHG as you would any other liquid soap.  You can apply chg directly  to the skin and wash                       Gently with a  scrungie or clean washcloth.  5.  Apply the CHG Soap to your body ONLY FROM THE NECK DOWN.   Do not use on face/ open                           Wound or open sores. Avoid contact with eyes, ears mouth and genitals (private parts).                       Wash face,  Genitals (private parts) with your normal soap.             6.  Wash thoroughly, paying special attention to the area where your surgery  will be performed.  7.  Thoroughly rinse your body with warm water from the neck down.  8.  DO NOT shower/wash with your normal soap after using and rinsing off  the CHG Soap.                9.  Pat yourself dry with a clean towel.            10.  Wear clean pajamas.            11.  Place clean sheets on your bed the night of your first shower and do not  sleep with pets. Day of Surgery : Do not apply any lotions/deodorants the morning of surgery.  Please wear clean clothes to the hospital/surgery center.  FAILURE TO FOLLOW THESE INSTRUCTIONS MAY RESULT IN THE CANCELLATION OF YOUR SURGERY PATIENT SIGNATURE_________________________________  NURSE SIGNATURE__________________________________  ________________________________________________________________________    CLEAR LIQUID DIET  THE DAY OF YOUR BOWEL PREP  FOLLOW BOWEL PREP PER MD INSTRUCTIONS   Foods Allowed                                                                      Foods Excluded  Coffee and tea, regular and decaf                             liquids that you cannot  Plain Jell-O in any flavor                                             see through such as: Fruit ices (not with fruit pulp)                                     milk, soups, orange juice  Iced Popsicles                                    All solid food Carbonated beverages, regular and diet                                    Cranberry, grape and apple juices Sports drinks like Gatorade  Lightly seasoned clear broth or consume(fat free) Sugar, honey syrup  Sample Menu Breakfast                                Lunch                                     Supper Cranberry juice                    Beef broth                            Chicken broth Jell-O                                     Grape juice                           Apple juice Coffee or tea                        Jell-O                                      Popsicle                                                Coffee or tea                        Coffee or tea  _____________________________________________________________________   WHAT IS A BLOOD TRANSFUSION? Blood Transfusion Information  A transfusion is the replacement of blood or some of its parts. Blood is made up of multiple cells which provide different functions.  Red blood cells carry oxygen and are used for blood loss replacement.  White blood cells fight against infection.  Platelets control bleeding.  Plasma helps clot blood.  Other blood products are available for specialized needs, such as hemophilia or other clotting disorders. BEFORE THE TRANSFUSION  Who gives blood for transfusions?   Healthy volunteers who are fully evaluated to make sure their blood is safe. This is blood bank blood. Transfusion therapy is the safest it has ever been in the practice of medicine. Before blood is taken from a donor, a complete history is taken to make sure  that person has no history of diseases nor engages in risky social behavior (examples are intravenous drug use or sexual activity with multiple partners). The donor's travel history is screened to minimize risk of transmitting infections, such as malaria. The donated blood is tested for signs of infectious diseases, such as HIV and hepatitis. The blood is then tested to be sure it is compatible with you in order to minimize the chance of a transfusion reaction. If you or a relative donates blood, this is often done in anticipation of surgery and is not appropriate for emergency situations. It takes many days to process the donated blood. RISKS AND COMPLICATIONS Although transfusion therapy is very safe  and saves many lives, the main dangers of transfusion include:   Getting an infectious disease.  Developing a transfusion reaction. This is an allergic reaction to something in the blood you were given. Every precaution is taken to prevent this. The decision to have a blood transfusion has been considered carefully by your caregiver before blood is given. Blood is not given unless the benefits outweigh the risks. AFTER THE TRANSFUSION  Right after receiving a blood transfusion, you will usually feel much better and more energetic. This is especially true if your red blood cells have gotten low (anemic). The transfusion raises the level of the red blood cells which carry oxygen, and this usually causes an energy increase.  The nurse administering the transfusion will monitor you carefully for complications. HOME CARE INSTRUCTIONS  No special instructions are needed after a transfusion. You may find your energy is better. Speak with your caregiver about any limitations on activity for underlying diseases you may have. SEEK MEDICAL CARE IF:   Your condition is not improving after your transfusion.  You develop redness or irritation at the intravenous (IV) site. SEEK IMMEDIATE MEDICAL CARE IF:  Any of  the following symptoms occur over the next 12 hours:  Shaking chills.  You have a temperature by mouth above 102 F (38.9 C), not controlled by medicine.  Chest, back, or muscle pain.  People around you feel you are not acting correctly or are confused.  Shortness of breath or difficulty breathing.  Dizziness and fainting.  You get a rash or develop hives.  You have a decrease in urine output.  Your urine turns a dark color or changes to pink, red, or brown. Any of the following symptoms occur over the next 10 days:  You have a temperature by mouth above 102 F (38.9 C), not controlled by medicine.  Shortness of breath.  Weakness after normal activity.  The white part of the eye turns yellow (jaundice).  You have a decrease in the amount of urine or are urinating less often.  Your urine turns a dark color or changes to pink, red, or brown. Document Released: 06/18/2000 Document Revised: 09/13/2011 Document Reviewed: 02/05/2008 ExitCare Patient Information 2014 [place].  _______________________________________________________________________  Incentive Spirometer  An incentive spirometer is a tool that can help keep your lungs clear and active. This tool measures how well you are filling your lungs with each breath. Taking long deep breaths may help reverse or decrease the chance of developing breathing (pulmonary) problems (especially infection) following:  A long period of time when you are unable to move or be active. BEFORE THE PROCEDURE   If the spirometer includes an indicator to show your best effort, your nurse or respiratory therapist will set it to a desired goal.  If possible, sit up straight or lean slightly forward. Try not to slouch.  Hold the incentive spirometer in an upright position. INSTRUCTIONS FOR USE  1. Sit on the edge of your bed if possible, or sit up as far as you can in bed or on a chair. 2. Hold the incentive spirometer in an  upright position. 3. Breathe out normally. 4. Place the mouthpiece in your mouth and seal your lips tightly around it. 5. Breathe in slowly and as deeply as possible, raising the piston or the ball toward the top of the column. 6. Hold your breath for 3-5 seconds or for as long as possible. Allow the piston or ball to fall to the bottom of the  column. 7. Remove the mouthpiece from your mouth and breathe out normally. 8. Rest for a few seconds and repeat Steps 1 through 7 at least 10 times every 1-2 hours when you are awake. Take your time and take a few normal breaths between deep breaths. 9. The spirometer may include an indicator to show your best effort. Use the indicator as a goal to work toward during each repetition. 10. After each set of 10 deep breaths, practice coughing to be sure your lungs are clear. If you have an incision (the cut made at the time of surgery), support your incision when coughing by placing a pillow or rolled up towels firmly against it. Once you are able to get out of bed, walk around indoors and cough well. You may stop using the incentive spirometer when instructed by your caregiver.  RISKS AND COMPLICATIONS  Take your time so you do not get dizzy or light-headed.  If you are in pain, you may need to take or ask for pain medication before doing incentive spirometry. It is harder to take a deep breath if you are having pain. AFTER USE  Rest and breathe slowly and easily.  It can be helpful to keep track of a log of your progress. Your caregiver can provide you with a simple table to help with this. If you are using the spirometer at home, follow these instructions: SEEK MEDICAL CARE IF:   You are having difficultly using the spirometer.  You have trouble using the spirometer as often as instructed.  Your pain medication is not giving enough relief while using the spirometer.  You develop fever of 100.5 F (38.1 C) or higher. SEEK IMMEDIATE MEDICAL CARE  IF:   You cough up bloody sputum that had not been present before.  You develop fever of 102 F (38.9 C) or greater.  You develop worsening pain at or near the incision site. MAKE SURE YOU:   Understand these instructions.  Will watch your condition.  Will get help right away if you are not doing well or get worse. Document Released: 11/01/2006 Document Revised: 09/13/2011 Document Reviewed: 01/02/2007 [facility] Patient Information 2014 [place], [place].   ________________________________________________________________________

## 2017-06-10 ENCOUNTER — Encounter (HOSPITAL_COMMUNITY)
Admission: RE | Admit: 2017-06-10 | Discharge: 2017-06-10 | Disposition: A | Discharge status: Home / Self Care | Payer: Managed Care, Other (non HMO) | Source: Ambulatory Visit | Attending: General Surgery | Admitting: General Surgery

## 2017-06-10 ENCOUNTER — Encounter (HOSPITAL_COMMUNITY): Payer: Self-pay

## 2017-06-10 ENCOUNTER — Other Ambulatory Visit: Payer: Self-pay

## 2017-06-10 DIAGNOSIS — Z01818 Encounter for other preprocedural examination: Secondary | ICD-10-CM | POA: Diagnosis present

## 2017-06-10 DIAGNOSIS — K5790 Diverticulosis of intestine, part unspecified, without perforation or abscess without bleeding: Secondary | ICD-10-CM | POA: Insufficient documentation

## 2017-06-10 HISTORY — DX: Unspecified osteoarthritis, unspecified site: M19.90

## 2017-06-10 HISTORY — DX: Diverticulosis of intestine, part unspecified, without perforation or abscess without bleeding: K57.90

## 2017-06-10 HISTORY — DX: Plantar fascial fibromatosis: M72.2

## 2017-06-10 LAB — CBC
HCT: 37.6 % (ref 36.0–46.0)
HEMOGLOBIN: 12.7 g/dL (ref 12.0–15.0)
MCH: 31.5 pg (ref 26.0–34.0)
MCHC: 33.8 g/dL (ref 30.0–36.0)
MCV: 93.3 fL (ref 78.0–100.0)
Platelets: 318 10*3/uL (ref 150–400)
RBC: 4.03 MIL/uL (ref 3.87–5.11)
RDW: 14.4 % (ref 11.5–15.5)
WBC: 6.3 10*3/uL (ref 4.0–10.5)

## 2017-06-10 LAB — ABO/RH: ABO/RH(D): O POS

## 2017-06-10 NOTE — Progress Notes (Signed)
SPOKE WITH KAREN DOLBY, PATIENT HAS DRINKS AND HAS BEEN SEEN

## 2017-06-14 NOTE — H&P (Addendum)
HPI: The patient is a 61 year old female who presents with diverticulitis. She was referred to Dr. Gaynelle AduEric Wilson by Dr Ewing SchleinMagod for ongoing complicated diveritculitis. She lives in [place] where her husband is a Buyer, retailgovernment contractor. She reports that around the first week of September she had LLQ pain and after about a week it did not improve so she went to the emergency room and was admitted to the hospital in outside country. She underwent a CT scan which showed sigmoid diverticulitis with several undrainable abscesses. She was placed on IV antibiotics and admitted for a week. Repeat CT showed no improvement and the surgeons in that country recommended surgery however the patient came [place] evaluation. She saw GI and was placed back on oral antibiotics which had to be switched to a different type due to intolerance. She underwent a repeat CT scan on 04/27/17 which showed no significant improvement in the diverticular abscess. She had an ongoing fluid collection of 4.8 x 3.4 cm which was not felt to be amenable to percutaneous drainage. She completed her antibiotic course. She was seen in the hospital and a repeat CT showed resolving inflammation.  It was decided to wait to do surgery until we could do it electively.  She denies any fevers, chills, diarrhea. She is taking MiraLAX daily. Her appetite is back to normal. She denies any nausea, vomiting, melena, or hematochezia. [place]. 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  She states that she had a prior colonoscopy with Dr. Madilyn FiremanHayes about 3 years ago which was unremarkable.   Past Medical History:  Diagnosis Date  . Anxiety   . Arthritis    LEFT FOOT  . Diverticular disease   . GERD (gastroesophageal reflux disease)   . Plantar fasciitis    Past Surgical History:  Procedure  Laterality Date  . EYE SURGERY Bilateral    LASIX EYE SURGERY  . 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 INFERTILITY SURGERY  32 YRS AGO  . MOUTH SURGERY     Family History  Problem Relation Age of Onset  . Arthritis Mother   . Prostate cancer Father   . Heart disease Father   . Cancer Sister    Social History   Socioeconomic History  . Marital status: Married    Spouse name: Not on file  . Number of children: Not on file  . Years of education: Not on file  . Highest education level: Not on file  Social Needs  . Financial resource strain: Not on file  . Food insecurity - worry: Not on file  . Food insecurity - inability: Not on file  . Transportation needs - medical: Not on file  . Transportation needs - non-medical: Not on file  Occupational History  . Not on file  Tobacco Use  . Smoking status: Former Smoker    Packs/day: 1.00    Years: 15.00    Pack years: 15.00    Types: Cigarettes  . Smokeless tobacco: Never Used  . Tobacco comment: QUIT 21 YRS AGO  Substance and Sexual Activity  . Alcohol use: Yes    Frequency: Never    Comment: 2-3 GLASSES WINE PER EVENING  . Drug use: No  . Sexual activity: Not Currently  Other Topics Concern  . Not  on file  Social History Narrative  . Not on file   Current Meds  Medication Sig  . Cholecalciferol (VITAMIN D3 PO) Take 1 capsule by mouth daily.  Marland Kitchen. estradiol (MINIVELLE) 0.05 MG/24HR patch Place 1 patch onto the skin See admin instructions. Place 1 patch on skin every 5 days  . ibuprofen (ADVIL,MOTRIN) 200 MG tablet Take 600 mg every 6 (six) hours as needed by mouth for mild pain or moderate pain.  Marland Kitchen. LORazepam (ATIVAN) 0.5 MG tablet Take 0.5 mg 2 (two) times daily as needed by mouth for anxiety.  . pantoprazole (PROTONIX) 40 MG tablet Take 40 mg daily by mouth.  . polyethylene glycol (MIRALAX / GLYCOLAX) packet Take 17 g daily as needed by mouth for mild constipation.  . Probiotic Product (PROBIOTIC DAILY)  CAPS Take 1 capsule daily by mouth.  . progesterone (PROMETRIUM) 200 MG capsule Take 200 mg by mouth See admin instructions. Take 200 mg by mouth daily for 14 days every other month   Allergies  Allergen Reactions  . Codeine Itching, Nausea And Vomiting and Other (See Comments)    Vomiting, itching, makes skin crawl   . Ivp Dye [Iodinated Diagnostic Agents]     Pt states "her sinuses clogged up for a few minutes after the CT scan". Denies throat swelling    Review of Systems - General ROS: negative for - chills, fatigue or fever Respiratory ROS: no cough, shortness of breath, or wheezing Cardiovascular ROS: no chest pain or dyspnea on exertion Gastrointestinal ROS: no abdominal pain, change in bowel habits, or black or bloody stools Genito-Urinary ROS: no dysuria, trouble voiding, or hematuria  Physical Exam  Constitutional: She is oriented to person, place, and time. She appears well-developed and well-nourished.  HENT:  Head: Normocephalic and atraumatic.  Eyes: EOM are normal. Pupils are equal, round, and reactive to light.  Neck: Normal range of motion. Neck supple.  Cardiovascular: Normal rate, regular rhythm and normal heart sounds.  Pulmonary/Chest: Effort normal.  Abdominal: Soft. She exhibits no distension. There is no tenderness.  Musculoskeletal: Normal range of motion.  Neurological: She is alert and oriented to person, place, and time.   Assessment:  61 yo F with complicated diverticulitis here for elective sigmoid resection.  We will inject her ureters with firefly to help identify these during surgery.   The surgery and anatomy were described to the patient as well as the risks of surgery and the possible complications.  These include: Bleeding, deep abdominal infections and possible wound complications such as hernia and infection, damage to adjacent structures, leak of surgical connections, which can lead to other surgeries and possibly an ostomy, possible need for  other procedures, such as abscess drains in radiology, possible prolonged hospital stay, possible diarrhea from removal of part of the colon, possible constipation from narcotics, prolonged fatigue/weakness or appetite loss, possible early recurrence of of disease, possible complications of their medical problems such as heart disease or arrhythmias or lung problems, death (less than 1%). I believe the patient understands and wishes to proceed with the surgery.

## 2017-06-15 ENCOUNTER — Encounter (HOSPITAL_COMMUNITY): Payer: Self-pay | Admitting: Emergency Medicine

## 2017-06-15 ENCOUNTER — Inpatient Hospital Stay (HOSPITAL_COMMUNITY): Payer: Managed Care, Other (non HMO) | Admitting: Certified Registered Nurse Anesthetist

## 2017-06-15 ENCOUNTER — Other Ambulatory Visit: Payer: Self-pay

## 2017-06-15 ENCOUNTER — Inpatient Hospital Stay (HOSPITAL_COMMUNITY)
Admission: RE | Admit: 2017-06-15 | Discharge: 2017-06-17 | DRG: 331 | Disposition: A | Discharge status: Home / Self Care | Payer: Managed Care, Other (non HMO) | Source: Ambulatory Visit | Attending: General Surgery | Admitting: General Surgery

## 2017-06-15 ENCOUNTER — Encounter (HOSPITAL_COMMUNITY)
Admission: RE | Disposition: A | Discharge status: Home / Self Care | Payer: Self-pay | Source: Ambulatory Visit | Attending: General Surgery

## 2017-06-15 DIAGNOSIS — K5792 Diverticulitis of intestine, part unspecified, without perforation or abscess without bleeding: Secondary | ICD-10-CM | POA: Diagnosis present

## 2017-06-15 DIAGNOSIS — Z6832 Body mass index (BMI) 32.0-32.9, adult: Secondary | ICD-10-CM | POA: Diagnosis not present

## 2017-06-15 DIAGNOSIS — Z87891 Personal history of nicotine dependence: Secondary | ICD-10-CM | POA: Diagnosis not present

## 2017-06-15 DIAGNOSIS — K5720 Diverticulitis of large intestine with perforation and abscess without bleeding: Principal | ICD-10-CM | POA: Diagnosis present

## 2017-06-15 DIAGNOSIS — K219 Gastro-esophageal reflux disease without esophagitis: Secondary | ICD-10-CM | POA: Diagnosis present

## 2017-06-15 DIAGNOSIS — F419 Anxiety disorder, unspecified: Secondary | ICD-10-CM | POA: Diagnosis present

## 2017-06-15 DIAGNOSIS — K5790 Diverticulosis of intestine, part unspecified, without perforation or abscess without bleeding: Secondary | ICD-10-CM | POA: Diagnosis present

## 2017-06-15 DIAGNOSIS — Z79899 Other long term (current) drug therapy: Secondary | ICD-10-CM

## 2017-06-15 HISTORY — PX: CYSTOSCOPY: SHX5120

## 2017-06-15 LAB — TYPE AND SCREEN
ABO/RH(D): O POS
ANTIBODY SCREEN: NEGATIVE

## 2017-06-15 SURGERY — COLECTOMY, PARTIAL, ROBOT-ASSISTED, LAPAROSCOPIC
Anesthesia: General | Site: Abdomen

## 2017-06-15 MED ORDER — ROCURONIUM BROMIDE 50 MG/5ML IV SOSY
PREFILLED_SYRINGE | INTRAVENOUS | Status: AC
  Filled 2017-06-15: qty 5

## 2017-06-15 MED ORDER — FENTANYL CITRATE (PF) 100 MCG/2ML IJ SOLN
INTRAMUSCULAR | Status: DC | PRN
  Administered 2017-06-15 (×2): 50 ug via INTRAVENOUS
  Administered 2017-06-15: 100 ug via INTRAVENOUS
  Administered 2017-06-15: 50 ug via INTRAVENOUS

## 2017-06-15 MED ORDER — GABAPENTIN 300 MG PO CAPS
300.0000 mg | ORAL_CAPSULE | Freq: Two times a day (BID) | ORAL | Status: DC
  Administered 2017-06-15 – 2017-06-17 (×4): 300 mg via ORAL
  Filled 2017-06-15 (×4): qty 1

## 2017-06-15 MED ORDER — ONDANSETRON HCL 4 MG PO TABS: 4.0000 mg | ORAL_TABLET | Freq: Four times a day (QID) | ORAL | Status: DC | PRN

## 2017-06-15 MED ORDER — LORAZEPAM 0.5 MG PO TABS: 0.5000 mg | ORAL_TABLET | Freq: Two times a day (BID) | ORAL | Status: DC | PRN

## 2017-06-15 MED ORDER — MIDAZOLAM HCL 5 MG/5ML IJ SOLN
INTRAMUSCULAR | Status: DC | PRN
  Administered 2017-06-15: 2 mg via INTRAVENOUS

## 2017-06-15 MED ORDER — ENOXAPARIN SODIUM 40 MG/0.4ML ~~LOC~~ SOLN
40.0000 mg | SUBCUTANEOUS | Status: DC
  Administered 2017-06-16: 40 mg via SUBCUTANEOUS
  Filled 2017-06-15: qty 0.4

## 2017-06-15 MED ORDER — PHENYLEPHRINE 40 MCG/ML (10ML) SYRINGE FOR IV PUSH (FOR BLOOD PRESSURE SUPPORT)
PREFILLED_SYRINGE | INTRAVENOUS | Status: AC
  Filled 2017-06-15: qty 10

## 2017-06-15 MED ORDER — LACTATED RINGERS IV SOLN
INTRAVENOUS | Status: DC | PRN
  Administered 2017-06-15 (×3): via INTRAVENOUS

## 2017-06-15 MED ORDER — PROPOFOL 10 MG/ML IV BOLUS
INTRAVENOUS | Status: AC
  Filled 2017-06-15: qty 20

## 2017-06-15 MED ORDER — BUPIVACAINE-EPINEPHRINE (PF) 0.25% -1:200000 IJ SOLN
INTRAMUSCULAR | Status: AC
  Filled 2017-06-15: qty 30

## 2017-06-15 MED ORDER — CHLORHEXIDINE GLUCONATE CLOTH 2 % EX PADS: 6.0000 | MEDICATED_PAD | Freq: Once | CUTANEOUS | Status: DC

## 2017-06-15 MED ORDER — LIDOCAINE 2% (20 MG/ML) 5 ML SYRINGE
INTRAMUSCULAR | Status: DC | PRN
  Administered 2017-06-15: 80 mg via INTRAVENOUS

## 2017-06-15 MED ORDER — DIPHENHYDRAMINE HCL 50 MG/ML IJ SOLN: 25.0000 mg | Freq: Four times a day (QID) | INTRAMUSCULAR | Status: DC | PRN

## 2017-06-15 MED ORDER — MIDAZOLAM HCL 2 MG/2ML IJ SOLN
INTRAMUSCULAR | Status: AC
  Filled 2017-06-15: qty 2

## 2017-06-15 MED ORDER — CEFOTETAN DISODIUM-DEXTROSE 2-2.08 GM-%(50ML) IV SOLR
INTRAVENOUS | Status: AC
  Filled 2017-06-15: qty 50

## 2017-06-15 MED ORDER — ONDANSETRON HCL 4 MG/2ML IJ SOLN
INTRAMUSCULAR | Status: AC
  Filled 2017-06-15: qty 2

## 2017-06-15 MED ORDER — FENTANYL CITRATE (PF) 100 MCG/2ML IJ SOLN
INTRAMUSCULAR | Status: AC
  Filled 2017-06-15: qty 4

## 2017-06-15 MED ORDER — KCL IN DEXTROSE-NACL 20-5-0.45 MEQ/L-%-% IV SOLN
INTRAVENOUS | Status: DC
  Administered 2017-06-15 – 2017-06-16 (×2): via INTRAVENOUS
  Filled 2017-06-15 (×2): qty 1000

## 2017-06-15 MED ORDER — LIDOCAINE 2% (20 MG/ML) 5 ML SYRINGE
INTRAMUSCULAR | Status: DC | PRN
  Administered 2017-06-15: 1.5 mg/kg/h via INTRAVENOUS

## 2017-06-15 MED ORDER — ROCURONIUM BROMIDE 50 MG/5ML IV SOSY
PREFILLED_SYRINGE | INTRAVENOUS | Status: DC | PRN
  Administered 2017-06-15: 10 mg via INTRAVENOUS
  Administered 2017-06-15: 20 mg via INTRAVENOUS
  Administered 2017-06-15: 50 mg via INTRAVENOUS
  Administered 2017-06-15: 10 mg via INTRAVENOUS

## 2017-06-15 MED ORDER — KETAMINE HCL 10 MG/ML IJ SOLN
INTRAMUSCULAR | Status: DC | PRN
  Administered 2017-06-15: 20 mg via INTRAVENOUS
  Administered 2017-06-15: 30 mg via INTRAVENOUS

## 2017-06-15 MED ORDER — ALVIMOPAN 12 MG PO CAPS
12.0000 mg | ORAL_CAPSULE | ORAL | Status: AC
  Administered 2017-06-15: 12 mg via ORAL
  Filled 2017-06-15: qty 1

## 2017-06-15 MED ORDER — KETAMINE HCL 10 MG/ML IJ SOLN
INTRAMUSCULAR | Status: AC
  Filled 2017-06-15: qty 1

## 2017-06-15 MED ORDER — LIDOCAINE 2% (20 MG/ML) 5 ML SYRINGE
INTRAMUSCULAR | Status: AC
  Filled 2017-06-15: qty 5

## 2017-06-15 MED ORDER — SACCHAROMYCES BOULARDII 250 MG PO CAPS
250.0000 mg | ORAL_CAPSULE | Freq: Two times a day (BID) | ORAL | Status: DC
  Administered 2017-06-15 – 2017-06-17 (×4): 250 mg via ORAL
  Filled 2017-06-15 (×4): qty 1

## 2017-06-15 MED ORDER — FENTANYL CITRATE (PF) 100 MCG/2ML IJ SOLN
25.0000 ug | INTRAMUSCULAR | Status: DC | PRN
Start: 2017-06-15 — End: 2017-06-15
  Administered 2017-06-15 (×3): 50 ug via INTRAVENOUS

## 2017-06-15 MED ORDER — DEXTROSE 5 % IV SOLN
2.0000 g | Freq: Two times a day (BID) | INTRAVENOUS | Status: AC
  Administered 2017-06-15: 2 g via INTRAVENOUS
  Filled 2017-06-15: qty 2

## 2017-06-15 MED ORDER — ALVIMOPAN 12 MG PO CAPS: 12.0000 mg | ORAL_CAPSULE | Freq: Two times a day (BID) | ORAL | Status: DC

## 2017-06-15 MED ORDER — SUGAMMADEX SODIUM 200 MG/2ML IV SOLN
INTRAVENOUS | Status: DC | PRN
  Administered 2017-06-15: 200 mg via INTRAVENOUS

## 2017-06-15 MED ORDER — METHYLENE BLUE 0.5 % INJ SOLN
INTRAVENOUS | Status: AC
  Filled 2017-06-15: qty 10

## 2017-06-15 MED ORDER — ACETAMINOPHEN 500 MG PO TABS
1000.0000 mg | ORAL_TABLET | ORAL | Status: AC
  Administered 2017-06-15: 1000 mg via ORAL
  Filled 2017-06-15: qty 2

## 2017-06-15 MED ORDER — DEXAMETHASONE SODIUM PHOSPHATE 10 MG/ML IJ SOLN
INTRAMUSCULAR | Status: DC | PRN
  Administered 2017-06-15: 6 mg via INTRAVENOUS

## 2017-06-15 MED ORDER — SUCCINYLCHOLINE CHLORIDE 200 MG/10ML IV SOSY
PREFILLED_SYRINGE | INTRAVENOUS | Status: AC
  Filled 2017-06-15: qty 10

## 2017-06-15 MED ORDER — ONDANSETRON HCL 4 MG/2ML IJ SOLN: 4.0000 mg | Freq: Four times a day (QID) | INTRAMUSCULAR | Status: DC | PRN

## 2017-06-15 MED ORDER — SODIUM CHLORIDE 0.9 % IJ SOLN
INTRAMUSCULAR | Status: AC
  Filled 2017-06-15: qty 10

## 2017-06-15 MED ORDER — PHENYLEPHRINE 40 MCG/ML (10ML) SYRINGE FOR IV PUSH (FOR BLOOD PRESSURE SUPPORT)
PREFILLED_SYRINGE | INTRAVENOUS | Status: DC | PRN
  Administered 2017-06-15 (×3): 40 ug via INTRAVENOUS
  Administered 2017-06-15: 80 ug via INTRAVENOUS
  Administered 2017-06-15: 40 ug via INTRAVENOUS
  Administered 2017-06-15 (×2): 80 ug via INTRAVENOUS
  Administered 2017-06-15 (×2): 40 ug via INTRAVENOUS

## 2017-06-15 MED ORDER — PROPOFOL 10 MG/ML IV BOLUS
INTRAVENOUS | Status: DC | PRN
  Administered 2017-06-15: 150 mg via INTRAVENOUS

## 2017-06-15 MED ORDER — PANTOPRAZOLE SODIUM 40 MG PO TBEC
40.0000 mg | DELAYED_RELEASE_TABLET | Freq: Every day | ORAL | Status: DC
  Administered 2017-06-16 – 2017-06-17 (×2): 40 mg via ORAL
  Filled 2017-06-15 (×2): qty 1

## 2017-06-15 MED ORDER — ACETAMINOPHEN 500 MG PO TABS
1000.0000 mg | ORAL_TABLET | Freq: Four times a day (QID) | ORAL | Status: DC
  Administered 2017-06-15 – 2017-06-17 (×7): 1000 mg via ORAL
  Filled 2017-06-15 (×7): qty 2

## 2017-06-15 MED ORDER — BUPIVACAINE LIPOSOME 1.3 % IJ SUSP
INTRAMUSCULAR | Status: DC | PRN
  Administered 2017-06-15: 20 mL

## 2017-06-15 MED ORDER — SUGAMMADEX SODIUM 200 MG/2ML IV SOLN
INTRAVENOUS | Status: AC
Start: 2017-06-15 — End: 2017-06-15
  Filled 2017-06-15: qty 2

## 2017-06-15 MED ORDER — CEFOTETAN DISODIUM-DEXTROSE 2-2.08 GM-%(50ML) IV SOLR
2.0000 g | INTRAVENOUS | Status: AC
  Administered 2017-06-15: 2 g via INTRAVENOUS

## 2017-06-15 MED ORDER — ALUM & MAG HYDROXIDE-SIMETH 200-200-20 MG/5ML PO SUSP: 30.0000 mL | Freq: Four times a day (QID) | ORAL | Status: DC | PRN

## 2017-06-15 MED ORDER — ONDANSETRON HCL 4 MG/2ML IJ SOLN
INTRAMUSCULAR | Status: DC | PRN
  Administered 2017-06-15: 4 mg via INTRAVENOUS

## 2017-06-15 MED ORDER — BUPIVACAINE-EPINEPHRINE 0.25% -1:200000 IJ SOLN
INTRAMUSCULAR | Status: DC | PRN
  Administered 2017-06-15: 30 mL

## 2017-06-15 MED ORDER — LACTATED RINGERS IR SOLN
Status: DC | PRN
  Administered 2017-06-15: 1000 mL

## 2017-06-15 MED ORDER — LIDOCAINE HCL 2 % IJ SOLN
INTRAMUSCULAR | Status: AC
  Filled 2017-06-15: qty 20

## 2017-06-15 MED ORDER — DIPHENHYDRAMINE HCL 25 MG PO CAPS: 25.0000 mg | ORAL_CAPSULE | Freq: Four times a day (QID) | ORAL | Status: DC | PRN

## 2017-06-15 MED ORDER — HYDROMORPHONE HCL 1 MG/ML IJ SOLN
0.5000 mg | INTRAMUSCULAR | Status: DC | PRN
  Administered 2017-06-15: 0.5 mg via INTRAVENOUS
  Filled 2017-06-15: qty 0.5

## 2017-06-15 MED ORDER — 0.9 % SODIUM CHLORIDE (POUR BTL) OPTIME
TOPICAL | Status: DC | PRN
  Administered 2017-06-15: 2000 mL

## 2017-06-15 MED ORDER — INDOCYANINE GREEN 25 MG IV SOLR
INTRAVENOUS | Status: DC | PRN
  Administered 2017-06-15: 5 mg via INTRAVENOUS

## 2017-06-15 MED ORDER — FENTANYL CITRATE (PF) 250 MCG/5ML IJ SOLN
INTRAMUSCULAR | Status: AC
  Filled 2017-06-15: qty 5

## 2017-06-15 MED ORDER — BUPIVACAINE LIPOSOME 1.3 % IJ SUSP
20.0000 mL | Freq: Once | INTRAMUSCULAR | Status: DC
  Filled 2017-06-15: qty 20

## 2017-06-15 MED ORDER — HEPARIN SODIUM (PORCINE) 5000 UNIT/ML IJ SOLN
5000.0000 [IU] | Freq: Once | INTRAMUSCULAR | Status: AC
  Administered 2017-06-15: 5000 [IU] via SUBCUTANEOUS
  Filled 2017-06-15: qty 1

## 2017-06-15 MED ORDER — CELECOXIB 200 MG PO CAPS
200.0000 mg | ORAL_CAPSULE | ORAL | Status: AC
  Administered 2017-06-15: 200 mg via ORAL
  Filled 2017-06-15: qty 1

## 2017-06-15 MED ORDER — GABAPENTIN 300 MG PO CAPS
300.0000 mg | ORAL_CAPSULE | ORAL | Status: AC
  Administered 2017-06-15: 300 mg via ORAL
  Filled 2017-06-15: qty 1

## 2017-06-15 SURGICAL SUPPLY — 106 items
BAG URO CATCHER STRL LF (MISCELLANEOUS) ×3 IMPLANT
BLADE EXTENDED COATED 6.5IN (ELECTRODE) IMPLANT
CANNULA REDUC XI 12-8 STAPL (CANNULA) ×1
CANNULA REDUCER 12-8 DVNC XI (CANNULA) ×2 IMPLANT
CATH INTERMIT  6FR 70CM (CATHETERS) ×3 IMPLANT
CELLS DAT CNTRL 66122 CELL SVR (MISCELLANEOUS) IMPLANT
CLIP VESOLOCK LG 6/CT PURPLE (CLIP) IMPLANT
CLIP VESOLOCK MED 6/CT (CLIP) IMPLANT
CLOTH BEACON ORANGE TIMEOUT ST (SAFETY) ×3 IMPLANT
COVER FOOTSWITCH UNIV (MISCELLANEOUS) IMPLANT
COVER MAYO STAND STRL (DRAPES) IMPLANT
COVER SURGICAL LIGHT HANDLE (MISCELLANEOUS) ×6 IMPLANT
COVER TIP SHEARS 8 DVNC (MISCELLANEOUS) ×2 IMPLANT
COVER TIP SHEARS 8MM DA VINCI (MISCELLANEOUS) ×1
DECANTER SPIKE VIAL GLASS SM (MISCELLANEOUS) IMPLANT
DERMABOND ADVANCED (GAUZE/BANDAGES/DRESSINGS) ×1
DERMABOND ADVANCED .7 DNX12 (GAUZE/BANDAGES/DRESSINGS) ×2 IMPLANT
DEVICE PMI PUNCTURE CLOSURE (MISCELLANEOUS) ×3 IMPLANT
DRAIN CHANNEL 19F RND (DRAIN) IMPLANT
DRAPE ARM DVNC X/XI (DISPOSABLE) ×8 IMPLANT
DRAPE COLUMN DVNC XI (DISPOSABLE) ×2 IMPLANT
DRAPE DA VINCI XI ARM (DISPOSABLE) ×4
DRAPE DA VINCI XI COLUMN (DISPOSABLE) ×1
DRAPE SURG IRRIG POUCH 19X23 (DRAPES) ×3 IMPLANT
DRSG OPSITE POSTOP 4X10 (GAUZE/BANDAGES/DRESSINGS) IMPLANT
DRSG OPSITE POSTOP 4X6 (GAUZE/BANDAGES/DRESSINGS) ×3 IMPLANT
DRSG OPSITE POSTOP 4X8 (GAUZE/BANDAGES/DRESSINGS) IMPLANT
ELECT PENCIL ROCKER SW 15FT (MISCELLANEOUS) ×3 IMPLANT
ELECT REM PT RETURN 15FT ADLT (MISCELLANEOUS) ×3 IMPLANT
ENDOLOOP SUT PDS II  0 18 (SUTURE)
ENDOLOOP SUT PDS II 0 18 (SUTURE) IMPLANT
EVACUATOR SILICONE 100CC (DRAIN) IMPLANT
GAUZE SPONGE 4X4 12PLY STRL (GAUZE/BANDAGES/DRESSINGS) IMPLANT
GLOVE BIO SURGEON STRL SZ 6.5 (GLOVE) ×9 IMPLANT
GLOVE BIOGEL M STRL SZ7.5 (GLOVE) ×3 IMPLANT
GLOVE BIOGEL PI IND STRL 7.0 (GLOVE) ×6 IMPLANT
GLOVE BIOGEL PI INDICATOR 7.0 (GLOVE) ×3
GOWN STRL REUS W/TWL 2XL LVL3 (GOWN DISPOSABLE) ×9 IMPLANT
GOWN STRL REUS W/TWL LRG LVL3 (GOWN DISPOSABLE) ×12 IMPLANT
GOWN STRL REUS W/TWL XL LVL3 (GOWN DISPOSABLE) ×6 IMPLANT
GRASPER ENDOPATH ANVIL 10MM (MISCELLANEOUS) IMPLANT
GRASPER SUT TROCAR 14GX15 (MISCELLANEOUS) IMPLANT
GUIDEWIRE STR DUAL SENSOR (WIRE) ×3 IMPLANT
HOLDER FOLEY CATH W/STRAP (MISCELLANEOUS) ×3 IMPLANT
IRRIG SUCT STRYKERFLOW 2 WTIP (MISCELLANEOUS) ×3
IRRIGATION SUCT STRKRFLW 2 WTP (MISCELLANEOUS) ×2 IMPLANT
IRRIGATOR SUCT 8 DISP DVNC XI (IRRIGATION / IRRIGATOR) IMPLANT
IRRIGATOR SUCTION 8MM XI DISP (IRRIGATION / IRRIGATOR)
KIT PROCEDURE DA VINCI SI (MISCELLANEOUS) ×1
KIT PROCEDURE DVNC SI (MISCELLANEOUS) ×2 IMPLANT
MANIFOLD NEPTUNE II (INSTRUMENTS) ×3 IMPLANT
NEEDLE INSUFFLATION 14GA 120MM (NEEDLE) ×3 IMPLANT
PACK CARDIOVASCULAR III (CUSTOM PROCEDURE TRAY) ×3 IMPLANT
PACK COLON (CUSTOM PROCEDURE TRAY) ×3 IMPLANT
PACK CYSTO (CUSTOM PROCEDURE TRAY) ×3 IMPLANT
PORT LAP GEL ALEXIS MED 5-9CM (MISCELLANEOUS) ×3 IMPLANT
RTRCTR WOUND ALEXIS 18CM MED (MISCELLANEOUS)
SCISSORS LAP 5X35 DISP (ENDOMECHANICALS) ×3 IMPLANT
SEAL CANN UNIV 5-8 DVNC XI (MISCELLANEOUS) ×8 IMPLANT
SEAL XI 5MM-8MM UNIVERSAL (MISCELLANEOUS) ×4
SEALER VESSEL DA VINCI XI (MISCELLANEOUS) ×1
SEALER VESSEL EXT DVNC XI (MISCELLANEOUS) ×2 IMPLANT
SLEEVE ADV FIXATION 5X100MM (TROCAR) IMPLANT
SOLUTION ELECTROLUBE (MISCELLANEOUS) ×3 IMPLANT
STAPLER 45 BLU RELOAD XI (STAPLE) IMPLANT
STAPLER 45 BLUE RELOAD XI (STAPLE)
STAPLER 45 GREEN RELOAD XI (STAPLE) ×2
STAPLER 45 GRN RELOAD XI (STAPLE) ×4 IMPLANT
STAPLER CANNULA SEAL DVNC XI (STAPLE) ×2 IMPLANT
STAPLER CANNULA SEAL XI (STAPLE) ×1
STAPLER CIRC CVD 29MM 37CM (STAPLE) ×3 IMPLANT
STAPLER SHEATH (SHEATH) ×1
STAPLER SHEATH ENDOWRIST DVNC (SHEATH) ×2 IMPLANT
STAPLER VISISTAT 35W (STAPLE) IMPLANT
SUT ETHILON 2 0 PS N (SUTURE) IMPLANT
SUT NOVA NAB DX-16 0-1 5-0 T12 (SUTURE) ×3 IMPLANT
SUT NOVA NAB GS-21 0 18 T12 DT (SUTURE) IMPLANT
SUT NOVA NAB GS-21 1 T12 (SUTURE) IMPLANT
SUT PDS AB 1 CTX 36 (SUTURE) IMPLANT
SUT PDS AB 1 TP1 96 (SUTURE) IMPLANT
SUT PROLENE 2 0 KS (SUTURE) ×3 IMPLANT
SUT SILK 2 0 (SUTURE) ×1
SUT SILK 2 0 SH CR/8 (SUTURE) ×3 IMPLANT
SUT SILK 2-0 18XBRD TIE 12 (SUTURE) ×2 IMPLANT
SUT SILK 3 0 (SUTURE) ×1
SUT SILK 3 0 SH CR/8 (SUTURE) ×3 IMPLANT
SUT SILK 3-0 18XBRD TIE 12 (SUTURE) ×2 IMPLANT
SUT V-LOC BARB 180 2/0GR6 GS22 (SUTURE)
SUT VIC AB 2-0 SH 18 (SUTURE) ×6 IMPLANT
SUT VIC AB 2-0 SH 27 (SUTURE) ×1
SUT VIC AB 2-0 SH 27X BRD (SUTURE) ×2 IMPLANT
SUT VIC AB 3-0 SH 18 (SUTURE) IMPLANT
SUT VIC AB 4-0 PS2 18 (SUTURE) IMPLANT
SUT VIC AB 4-0 PS2 27 (SUTURE) IMPLANT
SUT VICRYL 0 UR6 27IN ABS (SUTURE) IMPLANT
SUTURE V-LC BRB 180 2/0GR6GS22 (SUTURE) IMPLANT
SYR 10ML ECCENTRIC (SYRINGE) ×3 IMPLANT
SYS LAPSCP GELPORT 120MM (MISCELLANEOUS)
SYSTEM LAPSCP GELPORT 120MM (MISCELLANEOUS) IMPLANT
TOWEL OR 17X26 10 PK STRL BLUE (TOWEL DISPOSABLE) ×3 IMPLANT
TOWEL OR NON WOVEN STRL DISP B (DISPOSABLE) ×3 IMPLANT
TRAY FOLEY CATH 14FRSI W/METER (CATHETERS) ×3 IMPLANT
TRAY FOLEY W/METER SILVER 16FR (SET/KITS/TRAYS/PACK) IMPLANT
TROCAR ADV FIXATION 5X100MM (TROCAR) ×3 IMPLANT
TUBING CONNECTING 10 (TUBING) ×6 IMPLANT
TUBING INSUFFLATION 10FT LAP (TUBING) ×3 IMPLANT

## 2017-06-15 NOTE — Op Note (Signed)
Operative Note   Preoperative diagnosis:  1.  Diverticulitis   Postoperative diagnosis: 1.  Diverticulitis  Procedure(s): 1.  Cystoscopy with bilateral retrograde pyelograms with ICG  Surgeon: Modena SlaterEugene Kaydyn Chism, MD  Assistants: None  Anesthesia: General  Complications: None immediate  EBL: Minimal  Specimens: 1.  None  Drains/Catheters: 1.  Foley catheter  Intraoperative findings: Normal urethra and bladder  Indication: 61 year old female with a history of diverticulitis who presents for colectomy.  Dr. Maisie Fushomas has requested intraoperative ICG retrograde instillation into the ureters.  Description of procedure:  The patient was identified and consent was obtained.  The patient was taken to the operating room and placed in the supine position.  The patient was placed under general anesthesia.  Perioperative antibiotics were administered.  The patient was placed in dorsal lithotomy.  Patient was prepped and draped in a standard sterile fashion and a timeout was performed.  A 23 French rigid cystoscope was advanced into the urethra and into the bladder.  A complete cystoscopy was performed with no abnormal findings.  The left ureter was cannulated with an open-ended ureteral catheter and 7.5 cc of a 50-50 ratio of ICG to normal saline was instilled into the ureter.  The ureteral catheter was withdrawn and inserted into the distal most portion of the right ureter.  Another 7.5 cc of a 50-50 ratio of ICG to normal saline was instilled into this ureter as well.  The scope was withdrawn and a Foley catheter placed.  This concluded the operation the case was handed over to Dr. Maisie Fushomas.  Plan: As per Dr. Maisie Fushomas

## 2017-06-15 NOTE — Consult Note (Signed)
H&P Physician requesting consult: Dr Maisie Fushomas  Chief Complaint: Diverticulitis  History of Present Illness: 61 year old female with complicated diverticulitis presents today for elective sigmoid resection.  I am consulted to perform firefly injection into bilateral ureters.  Past Medical History:  Diagnosis Date  . Anxiety   . Arthritis    LEFT FOOT  . Diverticular disease   . GERD (gastroesophageal reflux disease)   . Plantar fasciitis    Past Surgical History:  Procedure Laterality Date  . EYE SURGERY Bilateral    LASIX EYE SURGERY  . 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 INFERTILITY SURGERY  32 YRS AGO  . MOUTH SURGERY      Home Medications:  Medications Prior to Admission  Medication Sig Dispense Refill Last Dose  . Cholecalciferol (VITAMIN D3 PO) Take 1 capsule by mouth daily.   06/14/2017 at Unknown time  . estradiol (MINIVELLE) 0.05 MG/24HR patch Place 1 patch onto the skin See admin instructions. Place 1 patch on skin every 5 days   06/14/2017 at Unknown time  . ibuprofen (ADVIL,MOTRIN) 200 MG tablet Take 600 mg every 6 (six) hours as needed by mouth for mild pain or moderate pain.   06/14/2017 at Unknown time  . LORazepam (ATIVAN) 0.5 MG tablet Take 0.5 mg 2 (two) times daily as needed by mouth for anxiety.   06/15/2017 at 0500  . pantoprazole (PROTONIX) 40 MG tablet Take 40 mg daily by mouth.   06/15/2017 at 0500  . polyethylene glycol (MIRALAX / GLYCOLAX) packet Take 17 g daily as needed by mouth for mild constipation.   Past Week at Unknown time  . Probiotic Product (PROBIOTIC DAILY) CAPS Take 1 capsule daily by mouth.   06/14/2017 at Unknown time  . progesterone (PROMETRIUM) 200 MG capsule Take 200 mg by mouth See admin instructions. Take 200 mg by mouth daily for 14 days every other month   05/30/2017   Allergies:  Allergies  Allergen Reactions  . Codeine Itching, Nausea And Vomiting and Other (See Comments)    Vomiting, itching, makes skin  crawl   . Ivp Dye [Iodinated Diagnostic Agents]     Pt states "her sinuses clogged up for a few minutes after the CT scan". Denies throat swelling     Family History  Problem Relation Age of Onset  . Arthritis Mother   . Prostate cancer Father   . Heart disease Father   . Cancer Sister    Social History:  reports that she has quit smoking. Her smoking use included cigarettes. She has a 15.00 pack-year smoking history. she has never used smokeless tobacco. She reports that she drinks alcohol. She reports that she does not use drugs.  ROS: A complete review of systems was performed.  All systems are negative except for pertinent findings as noted. ROS   Physical Exam:  Vital signs in last 24 hours: Temp:  [98.1 F (36.7 C)] 98.1 F (36.7 C) (12/12 0602) Pulse Rate:  [78] 78 (12/12 0602) Resp:  [18] 18 (12/12 0602) BP: (137)/(86) 137/86 (12/12 0602) SpO2:  [99 %] 99 % (12/12 0602) Weight:  [85.7 kg (189 lb)] 85.7 kg (189 lb) (12/12 0620) General:  Alert and oriented, No acute distress HEENT: Normocephalic, atraumatic Neck: No JVD or lymphadenopathy Cardiovascular: Regular rate and rhythm Lungs: Regular rate and effort Abdomen: Soft, nontender, nondistended, no abdominal masses Back: No CVA tenderness Extremities: No edema Neurologic: Grossly intact  Laboratory Data:  No results found for  this or any previous visit (from the past 24 hour(s)). No results found for this or any previous visit (from the past 240 hour(s)). Creatinine: No results for input(s): CREATININE in the last 168 hours.  Impression/Assessment:  Diverticulitis  Plan:  Proceed with cystoscopy with bilateral firefly injection into the ureters  Ray ChurchEugene D Chijioke Lasser, III 06/15/2017, 8:00 AM

## 2017-06-15 NOTE — H&P (View-Only) (Signed)
H&P Physician requesting consult: Dr Thomas  Chief Complaint: Diverticulitis  History of Present Illness: 61-year-old female with complicated diverticulitis presents today for elective sigmoid resection.  I am consulted to perform firefly injection into bilateral ureters.  Past Medical History:  Diagnosis Date  . Anxiety   . Arthritis    LEFT FOOT  . Diverticular disease   . GERD (gastroesophageal reflux disease)   . Plantar fasciitis    Past Surgical History:  Procedure Laterality Date  . EYE SURGERY Bilateral    LASIX EYE SURGERY  . 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 INFERTILITY SURGERY  32 YRS AGO  . MOUTH SURGERY      Home Medications:  Medications Prior to Admission  Medication Sig Dispense Refill Last Dose  . Cholecalciferol (VITAMIN D3 PO) Take 1 capsule by mouth daily.   06/14/2017 at Unknown time  . estradiol (MINIVELLE) 0.05 MG/24HR patch Place 1 patch onto the skin See admin instructions. Place 1 patch on skin every 5 days   06/14/2017 at Unknown time  . ibuprofen (ADVIL,MOTRIN) 200 MG tablet Take 600 mg every 6 (six) hours as needed by mouth for mild pain or moderate pain.   06/14/2017 at Unknown time  . LORazepam (ATIVAN) 0.5 MG tablet Take 0.5 mg 2 (two) times daily as needed by mouth for anxiety.   06/15/2017 at 0500  . pantoprazole (PROTONIX) 40 MG tablet Take 40 mg daily by mouth.   06/15/2017 at 0500  . polyethylene glycol (MIRALAX / GLYCOLAX) packet Take 17 g daily as needed by mouth for mild constipation.   Past Week at Unknown time  . Probiotic Product (PROBIOTIC DAILY) CAPS Take 1 capsule daily by mouth.   06/14/2017 at Unknown time  . progesterone (PROMETRIUM) 200 MG capsule Take 200 mg by mouth See admin instructions. Take 200 mg by mouth daily for 14 days every other month   05/30/2017   Allergies:  Allergies  Allergen Reactions  . Codeine Itching, Nausea And Vomiting and Other (See Comments)    Vomiting, itching, makes skin  crawl   . Ivp Dye [Iodinated Diagnostic Agents]     Pt states "her sinuses clogged up for a few minutes after the CT scan". Denies throat swelling     Family History  Problem Relation Age of Onset  . Arthritis Mother   . Prostate cancer Father   . Heart disease Father   . Cancer Sister    Social History:  reports that she has quit smoking. Her smoking use included cigarettes. She has a 15.00 pack-year smoking history. she has never used smokeless tobacco. She reports that she drinks alcohol. She reports that she does not use drugs.  ROS: A complete review of systems was performed.  All systems are negative except for pertinent findings as noted. ROS   Physical Exam:  Vital signs in last 24 hours: Temp:  [98.1 F (36.7 C)] 98.1 F (36.7 C) (12/12 0602) Pulse Rate:  [78] 78 (12/12 0602) Resp:  [18] 18 (12/12 0602) BP: (137)/(86) 137/86 (12/12 0602) SpO2:  [99 %] 99 % (12/12 0602) Weight:  [85.7 kg (189 lb)] 85.7 kg (189 lb) (12/12 0620) General:  Alert and oriented, No acute distress HEENT: Normocephalic, atraumatic Neck: No JVD or lymphadenopathy Cardiovascular: Regular rate and rhythm Lungs: Regular rate and effort Abdomen: Soft, nontender, nondistended, no abdominal masses Back: No CVA tenderness Extremities: No edema Neurologic: Grossly intact  Laboratory Data:  No results found for   this or any previous visit (from the past 24 hour(s)). No results found for this or any previous visit (from the past 240 hour(s)). Creatinine: No results for input(s): CREATININE in the last 168 hours.  Impression/Assessment:  Diverticulitis  Plan:  Proceed with cystoscopy with bilateral firefly injection into the ureters  Ray ChurchEugene D Bell, III 06/15/2017, 8:00 AM

## 2017-06-15 NOTE — Anesthesia Preprocedure Evaluation (Signed)
Anesthesia Evaluation  Patient identified by MRN, date of birth, ID band Patient awake    Reviewed: Allergy & Precautions, NPO status , Patient's Chart, lab work & pertinent test results  History of Anesthesia Complications Negative for: history of anesthetic complications  Airway Mallampati: II  TM Distance: >3 FB Neck ROM: Full    Dental  (+) Teeth Intact   Pulmonary neg shortness of breath, neg sleep apnea, neg COPD, neg recent URI, former smoker,    breath sounds clear to auscultation       Cardiovascular negative cardio ROS   Rhythm:Regular     Neuro/Psych PSYCHIATRIC DISORDERS Anxiety negative neurological ROS     GI/Hepatic Neg liver ROS, GERD  Medicated and Controlled,  Endo/Other  Morbid obesity  Renal/GU negative Renal ROS     Musculoskeletal  (+) Arthritis ,   Abdominal   Peds  Hematology negative hematology ROS (+)   Anesthesia Other Findings   Reproductive/Obstetrics                             Anesthesia Physical Anesthesia Plan  ASA: II  Anesthesia Plan: General   Post-op Pain Management:    Induction: Intravenous  PONV Risk Score and Plan: 3 and Ondansetron, Dexamethasone and Treatment may vary due to age or medical condition  Airway Management Planned: Oral ETT  Additional Equipment: None  Intra-op Plan:   Post-operative Plan: Extubation in OR  Informed Consent: I have reviewed the patients History and Physical, chart, labs and discussed the procedure including the risks, benefits and alternatives for the proposed anesthesia with the patient or authorized representative who has indicated his/her understanding and acceptance.   Dental advisory given  Plan Discussed with: CRNA and Surgeon  Anesthesia Plan Comments:         Anesthesia Quick Evaluation

## 2017-06-15 NOTE — Transfer of Care (Signed)
Immediate Anesthesia Transfer of Care Note  Patient: Stephanie Ford  Procedure(s) Performed: XI ROBOT SIGMOIDECTOMY ERAS PATHWAY (N/A Abdomen) FIREFLY INJECTION (N/A )  Patient Location: PACU  Anesthesia Type:General  Level of Consciousness: drowsy and patient cooperative  Airway & Oxygen Therapy: Patient Spontanous Breathing and Patient connected to face mask  Post-op Assessment: Report given to RN and Post -op Vital signs reviewed and stable  Post vital signs: Reviewed and stable  Last Vitals:  Vitals:   06/15/17 0602  BP: 137/86  Pulse: 78  Resp: 18  Temp: 36.7 C  SpO2: 99%    Last Pain:  Vitals:   06/15/17 0602  TempSrc: Oral      Patients Stated Pain Goal: 4 (06/15/17 [id number])  Complications: No apparent anesthesia complications

## 2017-06-15 NOTE — Interval H&P Note (Signed)
History and Physical Interval Note:  06/15/2017 8:02 AM  Stephanie Ford  has presented today for surgery, with the diagnosis of diverticular disease  The various methods of treatment have been discussed with the patient and family. After consideration of risks, benefits and other options for treatment, the patient has consented to  Procedure(s): XI ROBOT PARTIAL COLECTOMY ERAS PATHWAY (N/A) FIREFLY INJECTION (N/A) as a surgical intervention .  The patient's history has been reviewed, patient examined, no change in status, stable for surgery.  I have reviewed the patient's chart and labs.  Questions were answered to the patient's satisfaction.     Ray ChurchEugene D Quentyn Kolbeck, III

## 2017-06-15 NOTE — Op Note (Signed)
06/15/2017  11:30 AM  PATIENT:  Stephanie Ford  61 y.o. female  Patient Care Team: Danie BinderNelson, Kristen M, PA-C as PCP - General  PRE-OPERATIVE DIAGNOSIS:  diverticular disease  POST-OPERATIVE DIAGNOSIS:  diverticular disease  PROCEDURE:   XI ROBOTIC SIGMOIDECTOMY   Surgeon(s): Romie Leveehomas, Charyl Minervini, MD Crista ElliotBell, Eugene D III, MD Andria MeuseWhite, Christopher M, MD  ASSISTANT: Dr Cliffton AstersWhite   ANESTHESIA:   local and general  EBL: 50ml Total I/O In: 1000 [I.V.:1000] Out: 525 [Urine:475; Blood:50]  Delay start of Pharmacological VTE agent (>24hrs) due to surgical blood loss or risk of bleeding:  no  DRAINS: none   SPECIMEN:  Source of Specimen:  Sigmoid colon  DISPOSITION OF SPECIMEN:  PATHOLOGY  COUNTS:  YES  PLAN OF CARE: Admit to inpatient   PATIENT DISPOSITION:  PACU - hemodynamically stable.  INDICATION:    61 y.o. F with diverticular abscess.  I recommended segmental resection:  The anatomy & physiology of the digestive tract was discussed.  The pathophysiology was discussed.  Natural history risks without surgery was discussed.   I worked to give an overview of the disease and the frequent need to have multispecialty involvement.  I feel the risks of no intervention will lead to serious problems that outweigh the operative risks; therefore, I recommended a partial colectomy to remove the pathology.  Laparoscopic & open techniques were discussed.   Risks such as bleeding, infection, abscess, leak, reoperation, possible ostomy, hernia, heart attack, death, and other risks were discussed.  I noted a good likelihood this will help address the problem.   Goals of post-operative recovery were discussed as well.    The patient expressed understanding & wished to proceed with surgery.  OR FINDINGS:   Patient had a diverticular abscess that slowly resolved with antibiotics approximately 3 months ago.    The anastomosis rests 12 cm from the anal verge by rigid proctoscopy.  DESCRIPTION:    Informed consent was confirmed.  The patient underwent general anaesthesia without difficulty.  The patient was positioned appropriately.  VTE prevention in place.  The patient's abdomen was clipped, prepped, & draped in a sterile fashion.  Surgical timeout confirmed our plan.  The patient was positioned in reverse Trendelenburg.  Abdominal entry was gained using a Varies needle in the LUQ.  Entry was clean.  I induced carbon dioxide insufflation.  An 8mm robotic port was placed in the RUQ.  Camera inspection revealed no injury.  Extra ports were carefully placed under direct laparoscopic visualization.  I laparoscopically reflected the greater omentum and the upper abdomen the small bowel in the upper abdomen. The patient was appropriately positioned and the robot was docked to the patient's left side.  Instruments were placed under direct visualization.    I mobilized the sigmoid colon off of the pelvic sidewall down to the level of inflammation.  I scored the base of peritoneum of the right side of the mesentery of the left colon from the ligament of Treitz to the peritoneal reflection of the mid rectum.   I elevated the sigmoid mesentery and enetered into the retro-mesenteric plane. We were able to identify the left ureter and gonadal vessels using the firefly lens. We kept those posterior within the retroperitoneum and elevated the left colon mesentery off that.  The patient had significant inflammation of her left pelvic sidewall.  This was carefully dissected away from the ovary and left adnexa.  I did isolated IMA pedicle but did not ligate it yet.  I continued  distally and got into the avascular plane posterior to the mesorectum. This allowed me to help mobilize the rectum as well by freeing the mesorectum off the sacrum.  I mobilized the peritoneal coverings towards the peritoneal reflection on both the right and left sides of the rectum.  I could see the right and left ureters and stayed away from  them.    I skeletonized the inferior mesenteric artery pedicle.  I went down to its takeoff from the aorta.   I isolated the inferior mesenteric vein off of the ligament of Treitz just cephalad to that as well.  After confirming the left ureter was out of the way, I went ahead and ligated the inferior mesenteric artery pedicle with bipolar robotic vessel sealer ~2cm above its takeoff from the aorta.   I then bluntly freed the retroperitoneum.  I skeletonized the mesorectum at the junction at the proximal rectum using blunt dissection & bipolar robotic vessel sealer.  I mobilized the left colon in a lateral to medial fashion off the line of Toldt up towards the splenic flexure to ensure good mobilization of the left colon to reach into the pelvis.  Once this was completely divided the remaining mesentery to the level of the distal descending colon using the robotic vessel sealer.  We upsized the 8 mm right lower quadrant port to a 12 mm port and placed the stapler through this.  2 green loads of the robotic stapler were used to transect the rectosigmoid junction.    I then made a Pfannenstiel incision using a 10 blade scalpel.  Dissection was carried down through the fascia.  The fascia was incised horizontally and the peritoneum was then entered bluntly.  An Alexis wound protector was placed.  The remaining colon was brought out through this wound and transected proximal to the area of inflammation at the previous mesenteric dissection.  A pursestring stapler was placed.  A 2-0 Prolene pursestring was created.  A 29 mm EEA anvil was then placed and the pursestring was tied tightly around this.  This was then placed back into the abdomen and the cap was placed on the [place]rata[place]is.  Under laparoscopic visualization of the EEA stapler was inserted into the rectal stump and brought out through the anterior midline.  An anastomosis was created.  There was no tension when tested.  There was no leak when tested with  insufflation under water.  The abdomen was irrigated.  Hemostasis was good.  I then used a laparoscopic suture passer to close the 12 mm port site with a 0 Vicryl suture.  Once this was complete we switched to clean gowns, gloves, instruments and drapes.  The peritoneum of the Pfannenstiel incision was closed using a running 2-0 Vicryl suture.  The fascia was then closed using interrupted #1 Novafil suture.  The subcutaneous tissue was reapproximated using 2-0 Vicryl suture and the skin was closed using a running 4-0 Vicryl subcuticular suture.  The remaining port sites were closed also using the 4-0 Vicryl suture and Dermabond.  A dressing was placed over the Pfannenstiel incision and the patient was then awakened from anesthesia and sent to the postanesthesia care unit in stable condition.  All counts were correct per operating room staff.  An MD assistant was necessary for tissue manipulation, retraction and positioning due to the complexity of the case and hospital policies

## 2017-06-15 NOTE — Anesthesia Postprocedure Evaluation (Signed)
Anesthesia Post Note  Patient: Stephanie Ford  Procedure(s) Performed: XI ROBOT SIGMOIDECTOMY ERAS PATHWAY (N/A Abdomen) FIREFLY INJECTION (N/A )     Patient location during evaluation: PACU Anesthesia Type: General Level of consciousness: awake and alert Pain management: pain level controlled Vital Signs Assessment: post-procedure vital signs reviewed and stable Respiratory status: spontaneous breathing, nonlabored ventilation, respiratory function stable and patient connected to nasal cannula oxygen Cardiovascular status: blood pressure returned to baseline and stable Postop Assessment: no apparent nausea or vomiting Anesthetic complications: no    Last Vitals:  Vitals:   06/15/17 1651 06/15/17 1746  BP: 139/84 135/80  Pulse: 84 80  Resp: 16 15  Temp: 36.4 C 36.7 C  SpO2: 98% 98%    Last Pain:  Vitals:   06/15/17 1920  TempSrc:   PainSc: 0-No pain                 Megha Agnes

## 2017-06-15 NOTE — Anesthesia Procedure Notes (Signed)
Procedure Name: Intubation Date/Time: 06/15/2017 8:39 AM Performed by: Montel Clock, CRNA Pre-anesthesia Checklist: Patient identified, Emergency Drugs available, Suction available, Patient being monitored and Timeout performed Patient Re-evaluated:Patient Re-evaluated prior to induction Oxygen Delivery Method: Circle system utilized Preoxygenation: Pre-oxygenation with 100% oxygen Induction Type: IV induction Ventilation: Mask ventilation without difficulty and Oral airway inserted - appropriate to patient size Laryngoscope Size: Mac and 3 Grade View: Grade I Tube type: Oral Tube size: 7.0 mm Number of attempts: 1 Airway Equipment and Method: Stylet Placement Confirmation: ETT inserted through vocal cords under direct vision,  positive ETCO2 and breath sounds checked- equal and bilateral Secured at: 21 cm Tube secured with: Tape Dental Injury: Teeth and Oropharynx as per pre-operative assessment

## 2017-06-16 ENCOUNTER — Encounter (HOSPITAL_COMMUNITY): Payer: Self-pay | Admitting: General Surgery

## 2017-06-16 LAB — BASIC METABOLIC PANEL
ANION GAP: 7 (ref 5–15)
BUN: <5 mg/dL — ABNORMAL LOW (ref 6–20)
CALCIUM: 8.7 mg/dL — AB (ref 8.9–10.3)
CO2: 25 mmol/L (ref 22–32)
Chloride: 108 mmol/L (ref 101–111)
Creatinine, Ser: 0.5 mg/dL (ref 0.44–1.00)
GFR calc Af Amer: >60 mL/min (ref >60)
GFR calc non Af Amer: >60 mL/min (ref >60)
GLUCOSE: 106 mg/dL — AB (ref 65–99)
Potassium: 3.8 mmol/L (ref 3.5–5.1)
Sodium: 140 mmol/L (ref 135–145)

## 2017-06-16 LAB — CBC
HEMATOCRIT: 35.3 % — AB (ref 36.0–46.0)
HEMOGLOBIN: 11.9 g/dL — AB (ref 12.0–15.0)
MCH: 31.7 pg (ref 26.0–34.0)
MCHC: 33.7 g/dL (ref 30.0–36.0)
MCV: 94.1 fL (ref 78.0–100.0)
Platelets: 258 10*3/uL (ref 150–400)
RBC: 3.75 MIL/uL — ABNORMAL LOW (ref 3.87–5.11)
RDW: 14.1 % (ref 11.5–15.5)
WBC: 8 10*3/uL (ref 4.0–10.5)

## 2017-06-16 MED ORDER — TRAMADOL HCL 50 MG PO TABS
50.0000 mg | ORAL_TABLET | Freq: Four times a day (QID) | ORAL | Status: DC | PRN
Start: 2017-06-16 — End: 2017-06-17

## 2017-06-16 NOTE — Progress Notes (Signed)
1 Day Post-Op Robotic sigmoidectomy, ERAS Subjective: Doing well.  Having bowel movements.  Pain minimal.    Objective: Vital signs in last 24 hours: Temp:  [97.6 F (36.4 C)-98.3 F (36.8 C)] 98.1 F (36.7 C) (12/13 0544) Pulse Rate:  [71-102] 73 (12/13 0544) Resp:  [9-26] 18 (12/13 0544) BP: (100-148)/(68-92) 113/68 (12/13 0544) SpO2:  [96 %-100 %] 98 % (12/13 0544)   Intake/Output from previous day: 12/12 0701 - 12/13 0700 In: 3813.8 [P.O.:840; I.V.:2973.8] Out: 3725 [Urine:3675; Blood:50] Intake/Output this shift: No intake/output data recorded.   General appearance: alert and cooperative GI: normal findings: soft, non-distended  Incision: no significant drainage, no significant erythema  Lab Results:  Recent Labs    06/16/17 0502  WBC 8.0  HGB 11.9*  HCT 35.3*  PLT 258   BMET Recent Labs    06/16/17 0502  NA 140  K 3.8  CL 108  CO2 25  GLUCOSE 106*  BUN <5*  CREATININE 0.50  CALCIUM 8.7*   PT/INR No results for input(s): LABPROT, INR in the last 72 hours. ABG No results for input(s): PHART, HCO3 in the last 72 hours.  Invalid input(s): PCO2, PO2  MEDS, Scheduled . acetaminophen  1,000 mg Oral Q6H  . enoxaparin (LOVENOX) injection  40 mg Subcutaneous Q24H  . gabapentin  300 mg Oral BID  . pantoprazole  40 mg Oral Daily  . saccharomyces boulardii  250 mg Oral BID    Studies/Results: No results found.  Assessment: s/p Procedure(s): XI ROBOT SIGMOIDECTOMY ERAS PATHWAY FIREFLY INJECTION Patient Active Problem List   Diagnosis Date Noted  . Diverticular disease 06/15/2017  . Diverticulitis of large intestine with abscess 05/16/2017    Expected post op course  Plan: Advance diet to soft foods SL MIV Ambulate  PO pain control Possible d/c tom   LOS: 1 day     .Vanita PandaAlicia C Lolitha Tortora, MD [facility], [place] [phone]   06/16/2017 7:15 AM

## 2017-06-17 LAB — BASIC METABOLIC PANEL
ANION GAP: 5 (ref 5–15)
BUN: 6 mg/dL (ref 6–20)
CHLORIDE: 107 mmol/L (ref 101–111)
CO2: 29 mmol/L (ref 22–32)
Calcium: 8.9 mg/dL (ref 8.9–10.3)
Creatinine, Ser: 0.53 mg/dL (ref 0.44–1.00)
GFR calc non Af Amer: >60 mL/min (ref >60)
Glucose, Bld: 98 mg/dL (ref 65–99)
POTASSIUM: 3.7 mmol/L (ref 3.5–5.1)
SODIUM: 141 mmol/L (ref 135–145)

## 2017-06-17 LAB — CBC
HEMATOCRIT: 34.6 % — AB (ref 36.0–46.0)
Hemoglobin: 11.5 g/dL — ABNORMAL LOW (ref 12.0–15.0)
MCH: 31.7 pg (ref 26.0–34.0)
MCHC: 33.2 g/dL (ref 30.0–36.0)
MCV: 95.3 fL (ref 78.0–100.0)
PLATELETS: 236 10*3/uL (ref 150–400)
RBC: 3.63 MIL/uL — AB (ref 3.87–5.11)
RDW: 14.3 % (ref 11.5–15.5)
WBC: 7.4 10*3/uL (ref 4.0–10.5)

## 2017-06-17 MED ORDER — ACETAMINOPHEN 500 MG PO TABS: 1000.0000 mg | ORAL_TABLET | Freq: Four times a day (QID) | ORAL | 0 refills | Status: AC | PRN

## 2017-06-17 NOTE — Discharge Summary (Signed)
Physician Discharge Summary  Patient ID: Stephanie Ford MRN: [id number] DOB/AGE: 1955-08-07 61 y.o.  Admit date: 06/15/2017 Discharge date: 06/17/2017  Admission Diagnoses:  Diverticular disease with abscess  Discharge Diagnoses:  Active Problems:   Diverticular disease   Discharged Condition: good  Hospital Course: Pt admitted after surgery.  Her diet was advanced as tolerated.  SHe began to have bowel movements by POD 1.  She was taking tylenol for pain control.  By POD 2 she was in stable condition for d/c to home.    Consults: None  Significant Diagnostic Studies: labs: cbc, chemistry  Treatments: IV hydration, analgesia: acetaminophen and surgery: robotic sigmoidectomy  Discharge Exam: Blood pressure (!) 144/83, pulse 78, temperature 98.1 F (36.7 C), temperature source Oral, resp. rate 18, height 5\' 4"  (1.626 m), weight 85.7 kg (189 lb), SpO2 97 %. General appearance: alert and cooperative GI: normal findings: soft, non-tender Incision/Wound: clean, dry, intact  Disposition: 01-Home or Self Care   Allergies as of 06/17/2017      Reactions   Codeine Itching, Nausea And Vomiting, Other (See Comments)   Vomiting, itching, makes skin crawl    Ivp Dye [iodinated Diagnostic Agents]    Pt states "her sinuses clogged up for a few minutes after the CT scan". Denies throat swelling       Medication List    TAKE these medications   acetaminophen 500 MG tablet Commonly known as:  TYLENOL Take 2 tablets (1,000 mg total) by mouth every 6 (six) hours as needed.   ibuprofen 200 MG tablet Commonly known as:  ADVIL,MOTRIN Take 600 mg every 6 (six) hours as needed by mouth for mild pain or moderate pain.   LORazepam 0.5 MG tablet Commonly known as:  ATIVAN Take 0.5 mg 2 (two) times daily as needed by mouth for anxiety.   MINIVELLE 0.05 MG/24HR patch Generic drug:  estradiol Place 1 patch onto the skin See admin instructions. Place 1 patch on skin every 5 days    pantoprazole 40 MG tablet Commonly known as:  PROTONIX Take 40 mg daily by mouth.   polyethylene glycol packet Commonly known as:  MIRALAX / GLYCOLAX Take 17 g daily as needed by mouth for mild constipation.   PROBIOTIC DAILY Caps Take 1 capsule daily by mouth.   progesterone 200 MG capsule Commonly known as:  PROMETRIUM Take 200 mg by mouth See admin instructions. Take 200 mg by mouth daily for 14 days every other month   VITAMIN D3 PO Take 1 capsule by mouth daily.        Signed: Sanai Frick C. 06/17/2017, 7:47 AM

## 2017-06-17 NOTE — Progress Notes (Signed)
Discharge instructions given. Pt verbalized understanding and all questions were answered.  

## 2017-06-17 NOTE — Discharge Instructions (Signed)

## 2018-07-20 ENCOUNTER — Other Ambulatory Visit: Payer: Self-pay | Admitting: Gynecology

## 2018-07-20 ENCOUNTER — Other Ambulatory Visit: Payer: Self-pay | Admitting: Obstetrics and Gynecology

## 2018-07-20 DIAGNOSIS — N6310 Unspecified lump in the right breast, unspecified quadrant: Secondary | ICD-10-CM

## 2018-07-21 ENCOUNTER — Ambulatory Visit
Admission: RE | Admit: 2018-07-21 | Discharge: 2018-07-21 | Disposition: A | Discharge status: Home / Self Care | Payer: 59 | Source: Ambulatory Visit | Attending: Gynecology | Admitting: Gynecology

## 2018-07-21 ENCOUNTER — Ambulatory Visit
Admission: RE | Admit: 2018-07-21 | Discharge: 2018-07-21 | Disposition: A | Discharge status: Home / Self Care | Payer: Managed Care, Other (non HMO) | Source: Ambulatory Visit | Attending: Gynecology | Admitting: Gynecology

## 2018-07-21 DIAGNOSIS — N6310 Unspecified lump in the right breast, unspecified quadrant: Secondary | ICD-10-CM

## 2019-04-19 ENCOUNTER — Other Ambulatory Visit: Payer: Self-pay | Admitting: Physician Assistant

## 2019-04-19 DIAGNOSIS — N6310 Unspecified lump in the right breast, unspecified quadrant: Secondary | ICD-10-CM

## 2019-06-26 ENCOUNTER — Other Ambulatory Visit: Payer: Self-pay

## 2019-06-26 ENCOUNTER — Ambulatory Visit
Admission: RE | Admit: 2019-06-26 | Discharge: 2019-06-26 | Disposition: A | Discharge status: Home / Self Care | Payer: 59 | Source: Ambulatory Visit | Attending: Physician Assistant | Admitting: Physician Assistant

## 2019-06-26 ENCOUNTER — Other Ambulatory Visit: Payer: Self-pay | Admitting: Physician Assistant

## 2019-06-26 DIAGNOSIS — N6310 Unspecified lump in the right breast, unspecified quadrant: Secondary | ICD-10-CM

## 2019-07-09 ENCOUNTER — Ambulatory Visit
Admission: RE | Admit: 2019-07-09 | Discharge: 2019-07-09 | Disposition: A | Discharge status: Home / Self Care | Payer: 59 | Source: Ambulatory Visit | Attending: Physician Assistant | Admitting: Physician Assistant

## 2019-07-09 ENCOUNTER — Other Ambulatory Visit: Payer: Self-pay

## 2019-07-09 DIAGNOSIS — N6310 Unspecified lump in the right breast, unspecified quadrant: Secondary | ICD-10-CM

## 2019-07-10 ENCOUNTER — Ambulatory Visit
Admission: RE | Admit: 2019-07-10 | Discharge: 2019-07-10 | Disposition: A | Discharge status: Home / Self Care | Payer: 59 | Source: Ambulatory Visit | Attending: Orthopedic Surgery | Admitting: Orthopedic Surgery

## 2019-07-10 ENCOUNTER — Other Ambulatory Visit: Payer: Self-pay | Admitting: Orthopedic Surgery

## 2019-07-10 DIAGNOSIS — M25551 Pain in right hip: Secondary | ICD-10-CM

## 2019-07-10 MED ORDER — METHYLPREDNISOLONE ACETATE 40 MG/ML INJ SUSP (RADIOLOG
120.0000 mg | Freq: Once | INTRAMUSCULAR | Status: AC
  Administered 2019-07-10: 12:00:00 120 mg via INTRA_ARTICULAR

## 2019-07-10 MED ORDER — IOPAMIDOL (ISOVUE-M 200) INJECTION 41%
1.0000 mL | Freq: Once | INTRAMUSCULAR | Status: AC
  Administered 2019-07-10: 1 mL via INTRA_ARTICULAR

## 2019-09-07 ENCOUNTER — Other Ambulatory Visit: Payer: Self-pay | Admitting: Orthopedic Surgery

## 2019-09-07 DIAGNOSIS — M1611 Unilateral primary osteoarthritis, right hip: Secondary | ICD-10-CM

## 2019-10-11 ENCOUNTER — Other Ambulatory Visit: Payer: 59

## 2020-10-12 IMAGING — MG MM DIGITAL DIAGNOSTIC UNILAT*R* W/ TOMO W/ CAD
8 series · 8 of 24 positions shown · non-contrast
Comparison: Previous exam(s).

CLINICAL DATA: Follow-up probably benign asymmetry in the medial
right breast with no sonographic correlate.The patient did not
return for recommended six-month follow[place]up [place]

EXAM:
DIGITAL DIAGNOSTIC UNILATERAL RIGHT MAMMOGRAM WITH CAD AND TOMO

[R CC synth-2D (1 of 2)]
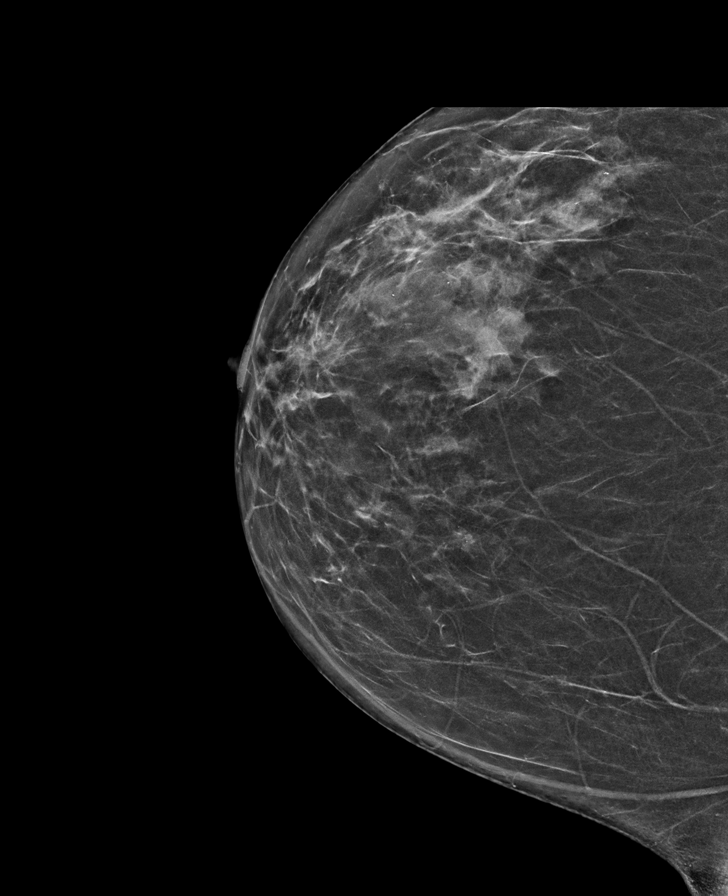

[R MLO synth-2D (1 of 2)]
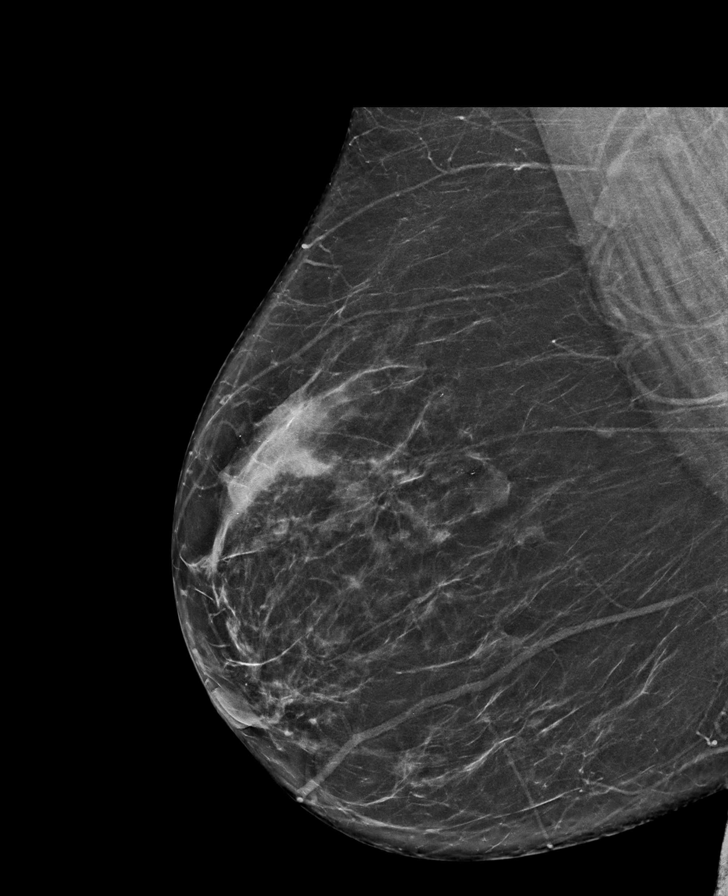

[R CC synth-2D (2 of 2)]
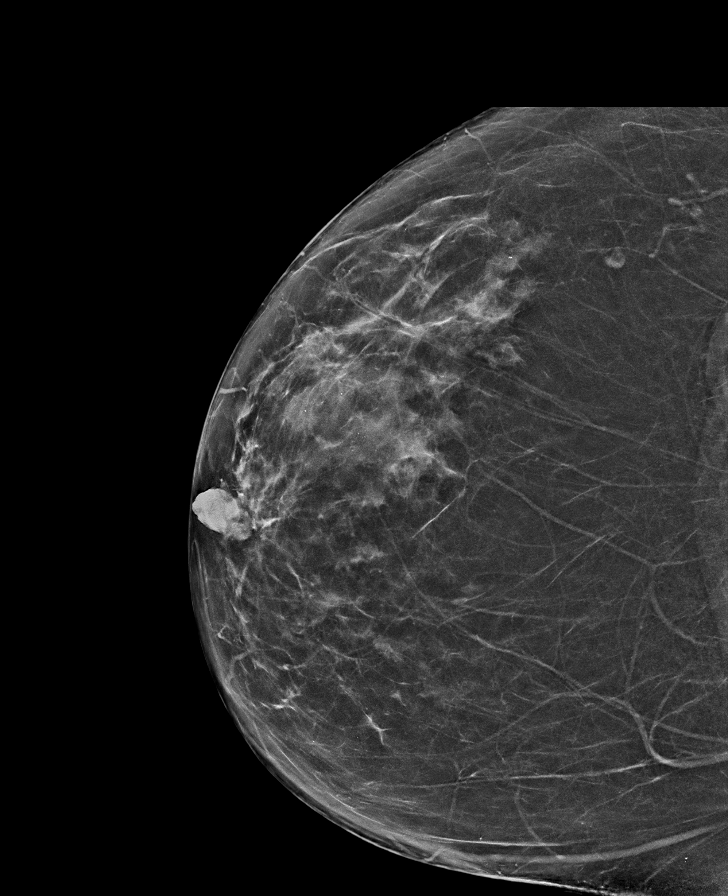

[R MLO synth-2D (2 of 2)]
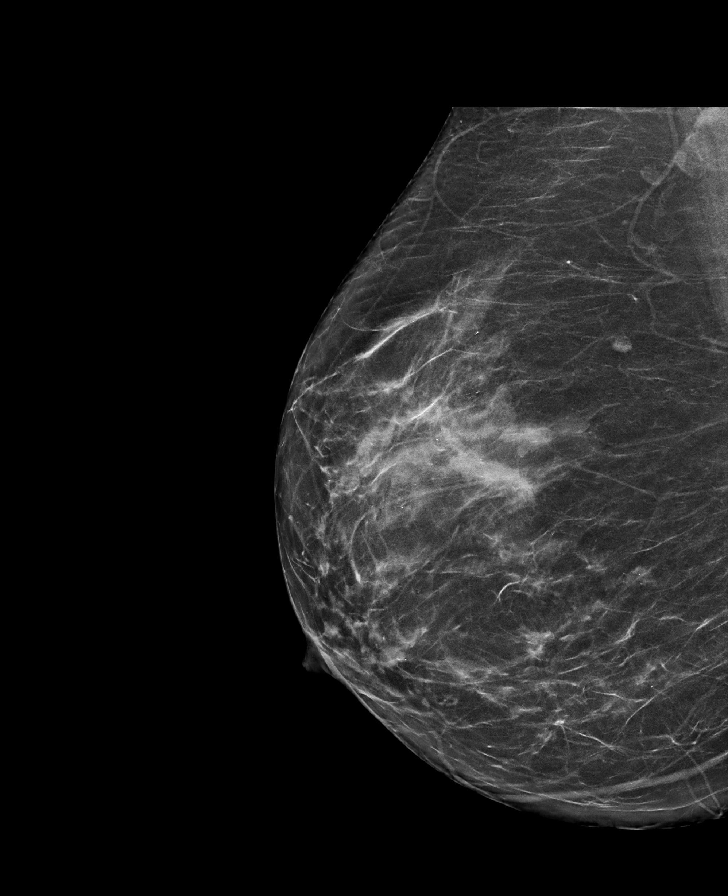

[R CC tomo (1 of 2) · tomo slice 37/73.0]
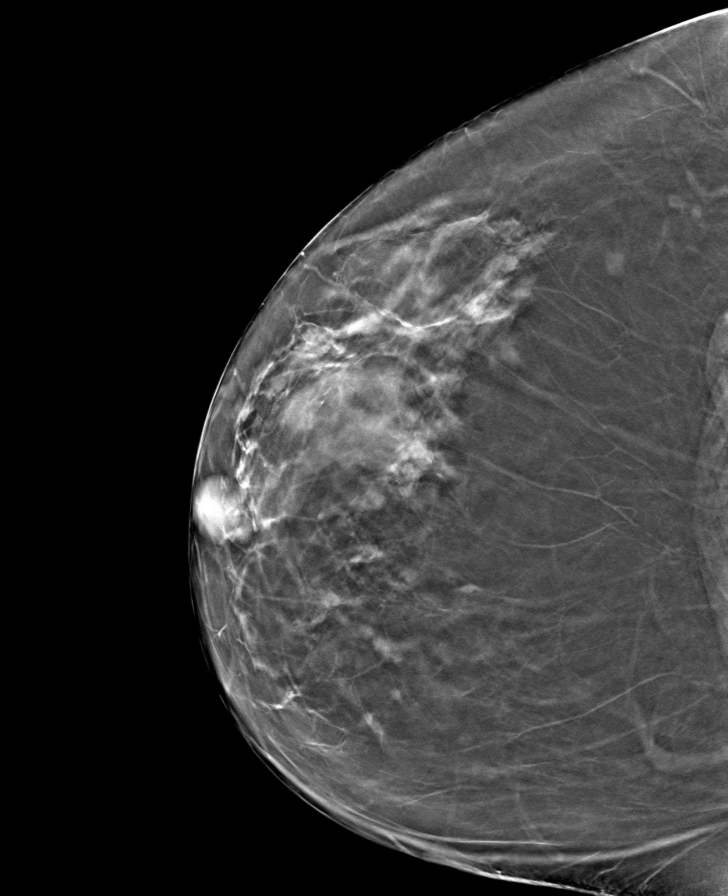

[R CC tomo (2 of 2) · tomo slice 34/67.0]
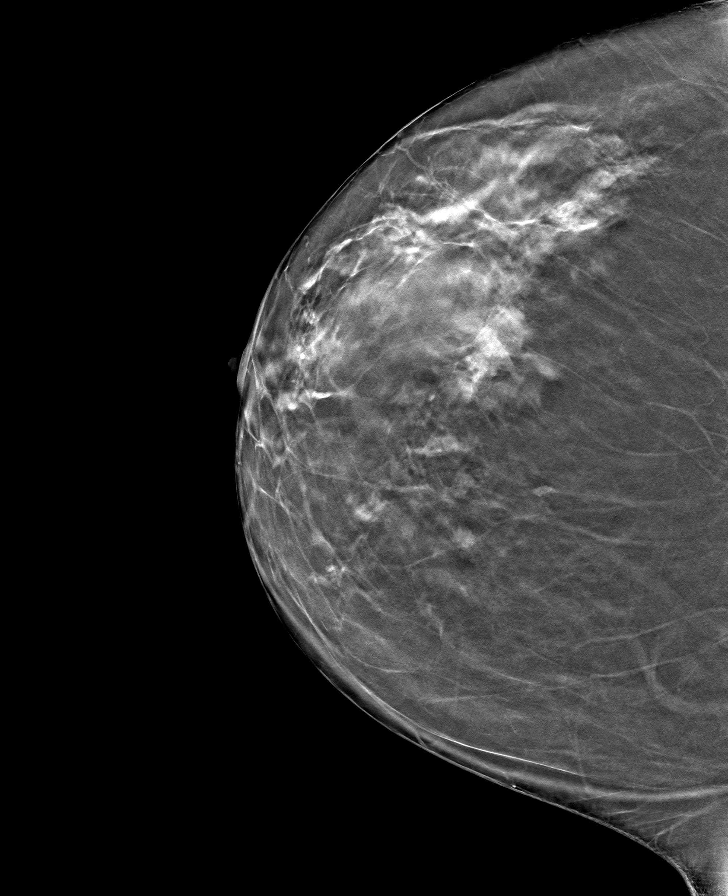

[R MLO tomo (1 of 2) · tomo slice 41/80.0]
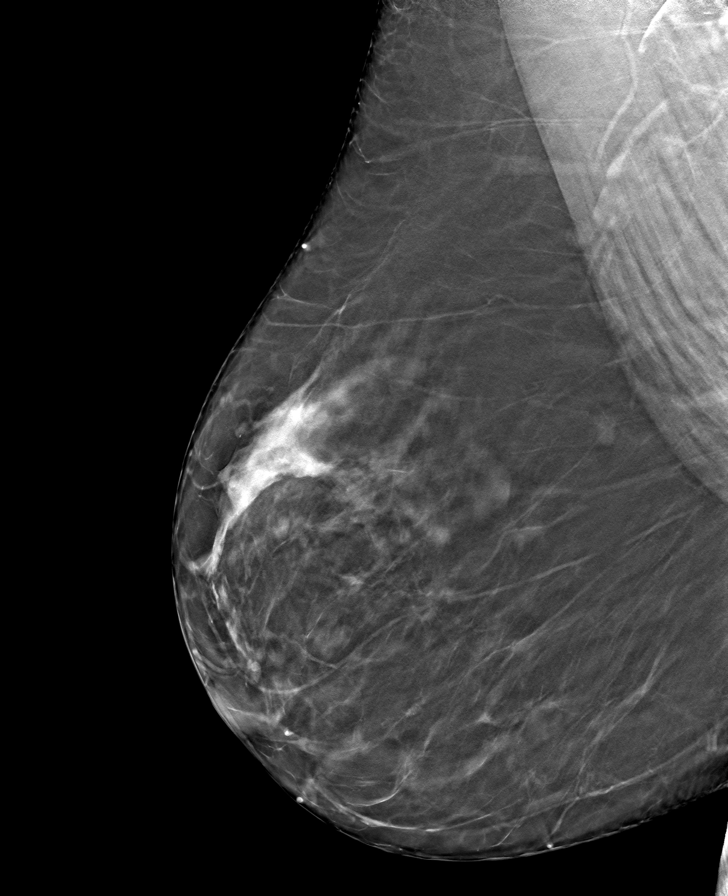

[R MLO tomo (2 of 2) · tomo slice 38/75.0]
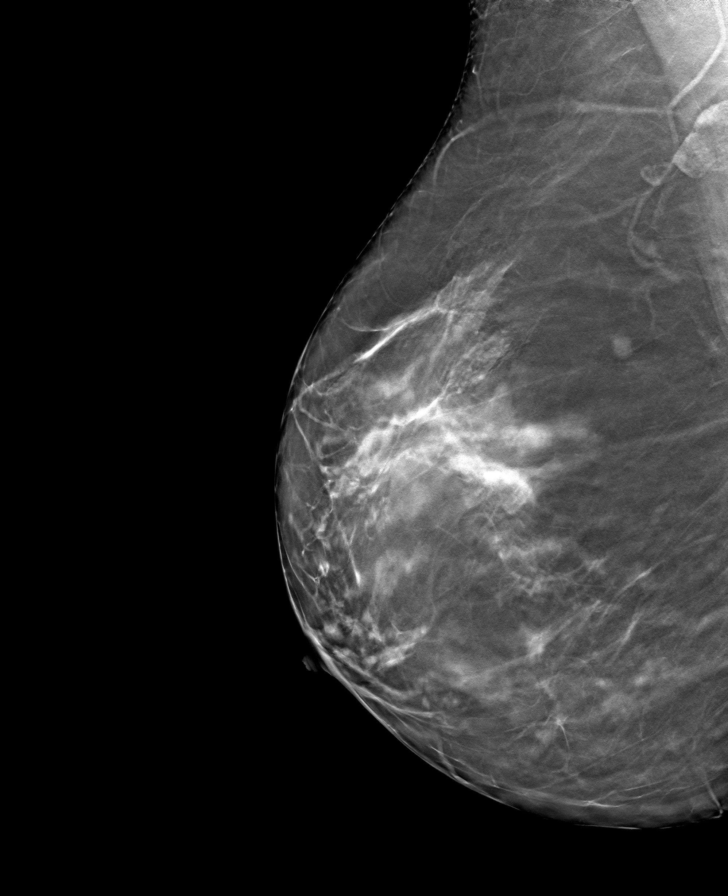

[8 of 24 positions shown; findings below may reference images not displayed]

ACR Breast Density Category c: The breast tissue is heterogeneously
dense, which may obscure small masses.
FINDINGS: 3D tomographic and 2D generated mammographic views of the right
breast demonstrate an interval decrease in size of the previously
demonstrated small asymmetry with tiny punctate calcifications in
the medial right breast. No interval findings suspicious for
malignancy.

Mammographic images were processed with CAD.
IMPRESSION: 1. The previously seen small asymmetry in the medial right breast is
smaller, compatible with a benign process.
2. No evidence of malignancy in either breast.

RECOMMENDATION:
Bilateral screening mammogram in 2 weeks when due.

I have discussed the findings and recommendations with the patient.
If applicable, a reminder letter will be sent to the patient
regarding the next appointment.

BI-RADS CATEGORY  2: Benign.

## 2020-10-13 IMAGING — XA DG FLUORO GUIDE NDL PLC/BX
1 series · 1 of 1 positions shown · non-contrast
Comparison: none

CLINICAL DATA: Right hip pain.  Osteoarthritis.

[Series 1: ortho adipose · 1 of 1 slices shown]
[im 1/1]
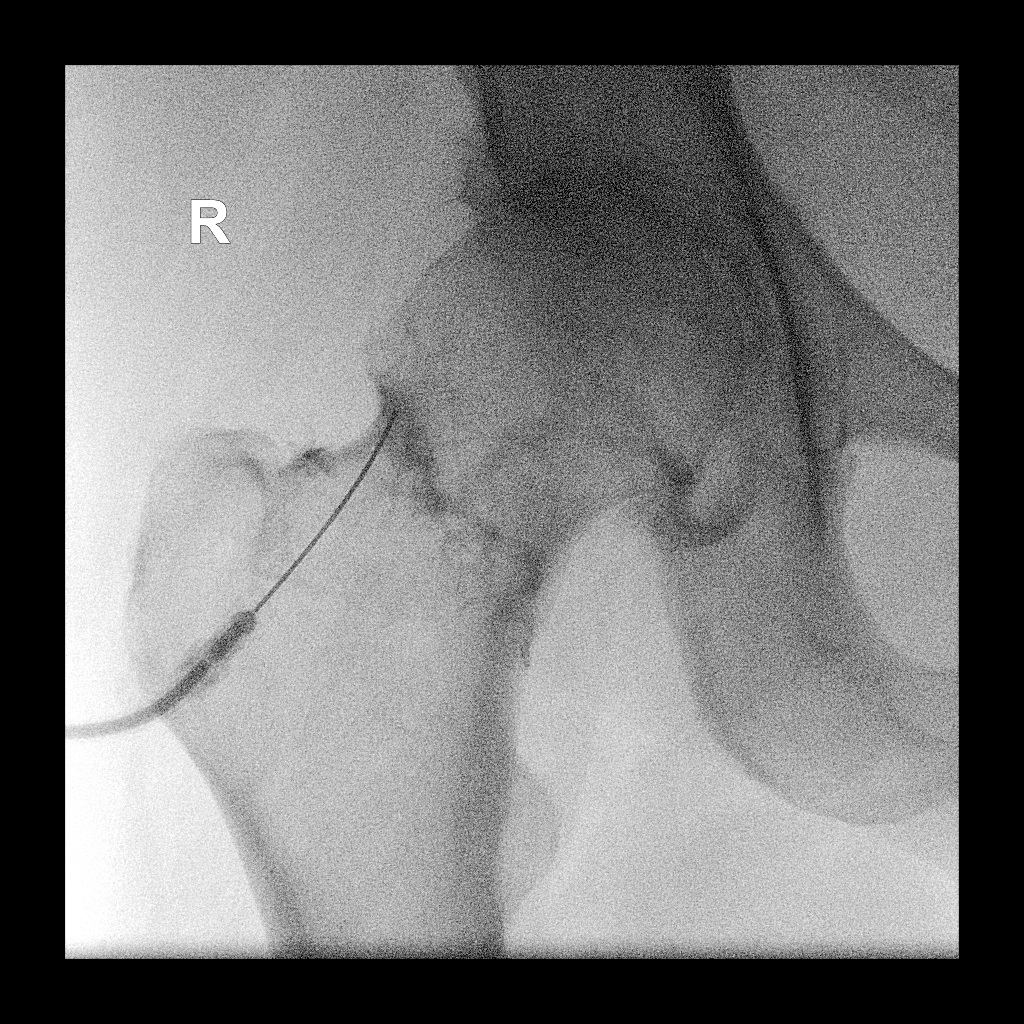

[1 of 1 positions shown; findings below may reference images not displayed]

EXAM:
RIGHT HIP INJECTION UNDER FLUOROSCOPY

FLUOROSCOPY TIME:  Fluoroscopy Time:  1 second

Radiation Exposure Index (if provided by the fluoroscopic device):
11.04 microGray*m^2

Number of Acquired Spot Images: 0

PROCEDURE:
The overlying skin was prepped with Betadine, draped in the usual
sterile fashion, and infiltrated locally with 1% lidocaine. A
inch 22 gauge spinal needle was advanced to the lateral aspect of
the right femoral head-neck junction. 1 mL of 1% lidocaine injected
easily. Diagnostic injection of a small amount of Isovue-M 200
demonstrated intra-articular spread without intravascular component.
120 mg of Depo-Medrol and 4 mL of 0.5% bupivacaine were then
administered. The needle was removed and a sterile dressing was
applied. There was no immediate complication.
IMPRESSION: Technically successful right hip injection under fluoroscopy.

## 2022-09-06 DIAGNOSIS — R051 Acute cough: Secondary | ICD-10-CM | POA: Diagnosis not present

## 2022-09-17 DIAGNOSIS — M1612 Unilateral primary osteoarthritis, left hip: Secondary | ICD-10-CM | POA: Diagnosis not present

## 2022-10-15 DIAGNOSIS — M1612 Unilateral primary osteoarthritis, left hip: Secondary | ICD-10-CM | POA: Diagnosis not present

## 2022-11-08 DIAGNOSIS — I10 Essential (primary) hypertension: Secondary | ICD-10-CM | POA: Diagnosis not present

## 2022-11-08 DIAGNOSIS — M1990 Unspecified osteoarthritis, unspecified site: Secondary | ICD-10-CM | POA: Diagnosis not present

## 2022-11-08 DIAGNOSIS — F418 Other specified anxiety disorders: Secondary | ICD-10-CM | POA: Diagnosis not present

## 2022-11-08 DIAGNOSIS — K2100 Gastro-esophageal reflux disease with esophagitis, without bleeding: Secondary | ICD-10-CM | POA: Diagnosis not present

## 2022-11-23 DIAGNOSIS — M1612 Unilateral primary osteoarthritis, left hip: Secondary | ICD-10-CM | POA: Diagnosis not present

## 2022-11-23 DIAGNOSIS — Z96642 Presence of left artificial hip joint: Secondary | ICD-10-CM | POA: Diagnosis not present

## 2022-12-29 DIAGNOSIS — Z471 Aftercare following joint replacement surgery: Secondary | ICD-10-CM | POA: Diagnosis not present

## 2023-01-14 DIAGNOSIS — E669 Obesity, unspecified: Secondary | ICD-10-CM | POA: Diagnosis not present

## 2023-01-14 DIAGNOSIS — I10 Essential (primary) hypertension: Secondary | ICD-10-CM | POA: Diagnosis not present

## 2023-03-09 DIAGNOSIS — M9903 Segmental and somatic dysfunction of lumbar region: Secondary | ICD-10-CM | POA: Diagnosis not present

## 2023-03-17 DIAGNOSIS — Z1231 Encounter for screening mammogram for malignant neoplasm of breast: Secondary | ICD-10-CM | POA: Diagnosis not present

## 2023-03-17 DIAGNOSIS — Z1151 Encounter for screening for human papillomavirus (HPV): Secondary | ICD-10-CM | POA: Diagnosis not present

## 2023-03-17 DIAGNOSIS — B3731 Acute candidiasis of vulva and vagina: Secondary | ICD-10-CM | POA: Diagnosis not present

## 2023-03-17 DIAGNOSIS — Z124 Encounter for screening for malignant neoplasm of cervix: Secondary | ICD-10-CM | POA: Diagnosis not present

## 2023-03-17 DIAGNOSIS — Z01411 Encounter for gynecological examination (general) (routine) with abnormal findings: Secondary | ICD-10-CM | POA: Diagnosis not present

## 2023-03-30 DIAGNOSIS — M9903 Segmental and somatic dysfunction of lumbar region: Secondary | ICD-10-CM | POA: Diagnosis not present

## 2023-04-20 DIAGNOSIS — M9903 Segmental and somatic dysfunction of lumbar region: Secondary | ICD-10-CM | POA: Diagnosis not present

## 2023-05-09 DIAGNOSIS — K080 Exfoliation of teeth due to systemic causes: Secondary | ICD-10-CM | POA: Diagnosis not present

## 2023-06-08 DIAGNOSIS — M9903 Segmental and somatic dysfunction of lumbar region: Secondary | ICD-10-CM | POA: Diagnosis not present

## 2023-07-13 DIAGNOSIS — M9903 Segmental and somatic dysfunction of lumbar region: Secondary | ICD-10-CM | POA: Diagnosis not present

## 2023-07-21 DIAGNOSIS — K219 Gastro-esophageal reflux disease without esophagitis: Secondary | ICD-10-CM | POA: Diagnosis not present

## 2023-07-21 DIAGNOSIS — Z0000 Encounter for general adult medical examination without abnormal findings: Secondary | ICD-10-CM | POA: Diagnosis not present

## 2023-07-21 DIAGNOSIS — E785 Hyperlipidemia, unspecified: Secondary | ICD-10-CM | POA: Diagnosis not present

## 2023-07-21 DIAGNOSIS — M25842 Other specified joint disorders, left hand: Secondary | ICD-10-CM | POA: Diagnosis not present

## 2023-07-21 DIAGNOSIS — I10 Essential (primary) hypertension: Secondary | ICD-10-CM | POA: Diagnosis not present

## 2023-07-25 DIAGNOSIS — M720 Palmar fascial fibromatosis [Dupuytren]: Secondary | ICD-10-CM | POA: Diagnosis not present

## 2023-08-03 DIAGNOSIS — M9903 Segmental and somatic dysfunction of lumbar region: Secondary | ICD-10-CM | POA: Diagnosis not present

## 2023-08-11 DIAGNOSIS — K080 Exfoliation of teeth due to systemic causes: Secondary | ICD-10-CM | POA: Diagnosis not present

## 2023-08-24 DIAGNOSIS — M9903 Segmental and somatic dysfunction of lumbar region: Secondary | ICD-10-CM | POA: Diagnosis not present

## 2023-09-15 DIAGNOSIS — M9903 Segmental and somatic dysfunction of lumbar region: Secondary | ICD-10-CM | POA: Diagnosis not present

## 2023-10-05 DIAGNOSIS — M9903 Segmental and somatic dysfunction of lumbar region: Secondary | ICD-10-CM | POA: Diagnosis not present

## 2023-10-26 DIAGNOSIS — M9903 Segmental and somatic dysfunction of lumbar region: Secondary | ICD-10-CM | POA: Diagnosis not present

## 2023-11-15 DIAGNOSIS — K080 Exfoliation of teeth due to systemic causes: Secondary | ICD-10-CM | POA: Diagnosis not present

## 2023-11-16 DIAGNOSIS — M9903 Segmental and somatic dysfunction of lumbar region: Secondary | ICD-10-CM | POA: Diagnosis not present

## 2023-11-23 DIAGNOSIS — K080 Exfoliation of teeth due to systemic causes: Secondary | ICD-10-CM | POA: Diagnosis not present

## 2023-12-07 DIAGNOSIS — M9903 Segmental and somatic dysfunction of lumbar region: Secondary | ICD-10-CM | POA: Diagnosis not present

## 2023-12-28 DIAGNOSIS — M9903 Segmental and somatic dysfunction of lumbar region: Secondary | ICD-10-CM | POA: Diagnosis not present

## 2024-01-13 DIAGNOSIS — F411 Generalized anxiety disorder: Secondary | ICD-10-CM | POA: Diagnosis not present

## 2024-01-13 DIAGNOSIS — E785 Hyperlipidemia, unspecified: Secondary | ICD-10-CM | POA: Diagnosis not present

## 2024-01-13 DIAGNOSIS — I10 Essential (primary) hypertension: Secondary | ICD-10-CM | POA: Diagnosis not present

## 2024-01-13 DIAGNOSIS — Z6830 Body mass index (BMI) 30.0-30.9, adult: Secondary | ICD-10-CM | POA: Diagnosis not present

## 2024-02-13 DIAGNOSIS — F411 Generalized anxiety disorder: Secondary | ICD-10-CM | POA: Diagnosis not present

## 2024-02-13 DIAGNOSIS — Z6831 Body mass index (BMI) 31.0-31.9, adult: Secondary | ICD-10-CM | POA: Diagnosis not present

## 2024-02-29 DIAGNOSIS — M9903 Segmental and somatic dysfunction of lumbar region: Secondary | ICD-10-CM | POA: Diagnosis not present

## 2024-03-21 DIAGNOSIS — M9903 Segmental and somatic dysfunction of lumbar region: Secondary | ICD-10-CM | POA: Diagnosis not present

## 2024-03-22 DIAGNOSIS — Z01419 Encounter for gynecological examination (general) (routine) without abnormal findings: Secondary | ICD-10-CM | POA: Diagnosis not present

## 2024-03-22 DIAGNOSIS — Z1231 Encounter for screening mammogram for malignant neoplasm of breast: Secondary | ICD-10-CM | POA: Diagnosis not present

## 2024-04-11 DIAGNOSIS — M9903 Segmental and somatic dysfunction of lumbar region: Secondary | ICD-10-CM | POA: Diagnosis not present

## 2024-05-02 DIAGNOSIS — M9903 Segmental and somatic dysfunction of lumbar region: Secondary | ICD-10-CM | POA: Diagnosis not present

## 2024-05-29 DIAGNOSIS — K080 Exfoliation of teeth due to systemic causes: Secondary | ICD-10-CM | POA: Diagnosis not present

## 2024-06-13 DIAGNOSIS — M9903 Segmental and somatic dysfunction of lumbar region: Secondary | ICD-10-CM | POA: Diagnosis not present
